# Patient Record
Sex: Female | Born: 1945
Health system: Southern US, Community
[De-identification: ages and names within clinical notes are randomized; demographics above are authoritative.]

## PROBLEM LIST (undated history)

## (undated) DIAGNOSIS — N189 Chronic kidney disease, unspecified: Secondary | ICD-10-CM

## (undated) DIAGNOSIS — T8859XA Other complications of anesthesia, initial encounter: Secondary | ICD-10-CM

## (undated) DIAGNOSIS — R002 Palpitations: Secondary | ICD-10-CM

## (undated) DIAGNOSIS — E785 Hyperlipidemia, unspecified: Secondary | ICD-10-CM

## (undated) DIAGNOSIS — I10 Essential (primary) hypertension: Secondary | ICD-10-CM

## (undated) DIAGNOSIS — M1990 Unspecified osteoarthritis, unspecified site: Secondary | ICD-10-CM

## (undated) DIAGNOSIS — Z87442 Personal history of urinary calculi: Secondary | ICD-10-CM

## (undated) DIAGNOSIS — I341 Nonrheumatic mitral (valve) prolapse: Secondary | ICD-10-CM

## (undated) HISTORY — PX: TUBAL LIGATION: SHX77

## (undated) HISTORY — PX: COLONOSCOPY: SHX174

## (undated) HISTORY — PX: WISDOM TOOTH EXTRACTION: SHX21

## (undated) HISTORY — DX: Essential (primary) hypertension: I10

## (undated) HISTORY — DX: Hyperlipidemia, unspecified: E78.5

---

## 1998-07-31 ENCOUNTER — Encounter: Payer: Self-pay | Admitting: Obstetrics and Gynecology

## 1998-08-03 ENCOUNTER — Ambulatory Visit (HOSPITAL_COMMUNITY): Admission: RE | Admit: 1998-08-03 | Discharge: 1998-08-03 | Payer: Self-pay | Admitting: Obstetrics and Gynecology

## 1999-12-09 ENCOUNTER — Other Ambulatory Visit: Admission: RE | Admit: 1999-12-09 | Discharge: 1999-12-09 | Payer: Self-pay | Admitting: Obstetrics and Gynecology

## 2000-08-28 ENCOUNTER — Other Ambulatory Visit: Admission: RE | Admit: 2000-08-28 | Discharge: 2000-08-28 | Payer: Self-pay | Admitting: Obstetrics and Gynecology

## 2000-10-13 ENCOUNTER — Ambulatory Visit (HOSPITAL_COMMUNITY): Admission: RE | Admit: 2000-10-13 | Discharge: 2000-10-13 | Payer: Self-pay | Admitting: Internal Medicine

## 2000-10-13 ENCOUNTER — Encounter: Payer: Self-pay | Admitting: Internal Medicine

## 2001-10-22 ENCOUNTER — Encounter: Admission: RE | Admit: 2001-10-22 | Discharge: 2001-10-22 | Payer: Self-pay | Admitting: Obstetrics and Gynecology

## 2001-10-22 ENCOUNTER — Encounter: Payer: Self-pay | Admitting: Obstetrics and Gynecology

## 2001-10-24 ENCOUNTER — Encounter: Payer: Self-pay | Admitting: Obstetrics and Gynecology

## 2001-10-24 ENCOUNTER — Encounter: Admission: RE | Admit: 2001-10-24 | Discharge: 2001-10-24 | Payer: Self-pay | Admitting: Obstetrics and Gynecology

## 2002-12-18 ENCOUNTER — Ambulatory Visit (HOSPITAL_COMMUNITY): Admission: RE | Admit: 2002-12-18 | Discharge: 2002-12-18 | Payer: Self-pay | Admitting: Obstetrics and Gynecology

## 2002-12-18 ENCOUNTER — Encounter (INDEPENDENT_AMBULATORY_CARE_PROVIDER_SITE_OTHER): Payer: Self-pay

## 2002-12-20 ENCOUNTER — Encounter: Admission: RE | Admit: 2002-12-20 | Discharge: 2002-12-20 | Payer: Self-pay | Admitting: Obstetrics and Gynecology

## 2002-12-20 ENCOUNTER — Encounter: Payer: Self-pay | Admitting: Obstetrics and Gynecology

## 2003-01-15 ENCOUNTER — Emergency Department (HOSPITAL_COMMUNITY): Admission: EM | Admit: 2003-01-15 | Discharge: 2003-01-15 | Payer: Self-pay | Admitting: Emergency Medicine

## 2003-01-15 ENCOUNTER — Encounter: Payer: Self-pay | Admitting: Emergency Medicine

## 2003-08-29 ENCOUNTER — Encounter (INDEPENDENT_AMBULATORY_CARE_PROVIDER_SITE_OTHER): Payer: Self-pay | Admitting: Specialist

## 2003-08-29 ENCOUNTER — Ambulatory Visit (HOSPITAL_COMMUNITY): Admission: RE | Admit: 2003-08-29 | Discharge: 2003-08-29 | Payer: Self-pay | Admitting: Gastroenterology

## 2003-09-22 ENCOUNTER — Other Ambulatory Visit: Admission: RE | Admit: 2003-09-22 | Discharge: 2003-09-22 | Payer: Self-pay | Admitting: Obstetrics and Gynecology

## 2004-10-01 ENCOUNTER — Other Ambulatory Visit: Admission: RE | Admit: 2004-10-01 | Discharge: 2004-10-01 | Payer: Self-pay | Admitting: Obstetrics and Gynecology

## 2005-10-06 ENCOUNTER — Other Ambulatory Visit: Admission: RE | Admit: 2005-10-06 | Discharge: 2005-10-06 | Payer: Self-pay | Admitting: Obstetrics and Gynecology

## 2006-10-12 ENCOUNTER — Other Ambulatory Visit: Admission: RE | Admit: 2006-10-12 | Discharge: 2006-10-12 | Payer: Self-pay | Admitting: Obstetrics and Gynecology

## 2007-10-15 ENCOUNTER — Other Ambulatory Visit: Admission: RE | Admit: 2007-10-15 | Discharge: 2007-10-15 | Payer: Self-pay | Admitting: Obstetrics and Gynecology

## 2008-01-17 ENCOUNTER — Other Ambulatory Visit: Admission: RE | Admit: 2008-01-17 | Discharge: 2008-01-17 | Payer: Self-pay | Admitting: Obstetrics and Gynecology

## 2008-07-16 ENCOUNTER — Other Ambulatory Visit: Admission: RE | Admit: 2008-07-16 | Discharge: 2008-07-16 | Payer: Self-pay | Admitting: Obstetrics and Gynecology

## 2009-01-09 ENCOUNTER — Other Ambulatory Visit: Admission: RE | Admit: 2009-01-09 | Discharge: 2009-01-09 | Payer: Self-pay | Admitting: Obstetrics and Gynecology

## 2010-02-16 ENCOUNTER — Other Ambulatory Visit: Admission: RE | Admit: 2010-02-16 | Discharge: 2010-02-16 | Payer: Self-pay | Admitting: Obstetrics and Gynecology

## 2010-09-17 NOTE — Op Note (Signed)
NAMEMARIYA, Kelly Reyes                      ACCOUNT NO.:  [id number]   MEDICAL RECORD NO.:  [id number]                   PATIENT TYPE:  AMB   LOCATION:  SDC                                  FACILITY:  [facility]   PHYSICIAN:  Artist Pais, M.D.                 DATE OF BIRTH:  1945-06-30   DATE OF PROCEDURE:  12/18/2002  DATE OF DISCHARGE:                                 OPERATIVE REPORT   PREOPERATIVE DIAGNOSES:  1. Polypoid lesion of posterior endometrium on sonohysterogram.  2. Postmenopausal bleeding.   POSTOPERATIVE DIAGNOSES:  1. Polypoid lesion of posterior endometrium on sonohysterogram.  2. Postmenopausal bleeding.  3. Polypoid lesion, left uterine wall.   PROCEDURES:  1. Dilatation and curettage.  2. Hysteroscopy.  3. Polypectomy.   SURGEON:  Artist Pais, M.D.   ANESTHESIA:  General endotracheal.   ESTIMATED BLOOD LOSS:  Minimal.   FLUIDS REPLACED:  1500 mL crystalloid.   COMPLICATIONS:  None.   DRAINS:  None.   DESCRIPTION OF OPERATION:  The patient was brought to the operating room,  identified on the operating room table.  After induction of adequate general  endotracheal anesthesia, the patient was placed in the dorsal lithotomy  position and prepped and draped in the usual sterile fashion.  The bladder  was straight catheterized with a red rubber catheter with no urine obtained.  Subsequently a bimanual examination revealed the uterus to be retroverted.  It had been noted to be retroverted on numerous occasions.  The posterior  lip of the cervix was infiltrated with 1 mL of 1% lidocaine, and the  remaining 19 mL were placed for a paracervical block.  Then cervix was then  very gently and carefully dilated up to a #23 Pratt dilator.  Dilatation  proceeded very gently and carefully to decrease the risk of uterine  perforation.  With the placement of a #2 Pratt dilator it was impossible to  dilate past that dilator size.  I did attempt several times with a  #25 but  was unable to achieve dilatation, and it was this surgeon's opinion that to  continue to try to dilate increased the risk of uterine perforation.  The  uterus did sound to approximately 8.5 cm.  Using sorbitol as a distending  medium, a hysteroscopic examination was performed.  In order to guide the  hysteroscope into the uterine cavity, I had to do this under direct  visualization.  I was able to guide the scope halfway up, but there was a  fibrous band which was thought to be probably the internal os.  It was  impossible to place the scope farther.  Subsequently using the serrated  curette after the scope was withdrawn, a careful and thorough curettage was  performed with a good cry heard all around.  Subsequently I did attempt to  dilate her further using a #23 Pratt dilator and was able to then gently  manipulate the scope up to the fundus of the uterus.  There were noted to be  no space-occupying lesions of the fundus of the uterus and, in fact, the  entire uterine cavity was noted to be well-sampled and clean with the  exception of a polypoid area of the left uterine sidewall.  We subsequently  curetted over this area and placed the Randall stone forceps with the tissue  obtained and sent to pathology for examination.  Subsequently the internal  os again constricted and was impossible to place the scope to confirm that  the polyp had been removed.  It was the surgeon's opinion that to redilate  her would increase the risk of uterine perforation and since I was able to  obtain the tissue, it was felt that to proceed further would be foolhardy.  At that point the procedure was then terminated.  The patient tolerated the  procedure well without apparent complications and was transferred to the  recovery room in stable condition after all instrument, sponge, and needle  counts were correct.  The patient received a post-D&C instruction sheet and  is urged to return to the office  in two weeks for a postoperative evaluation  and to call with any problems.                                               Artist Pais, M.D.    DC/MEDQ  D:  12/18/2002  T:  12/19/2002  Job:  [id number]

## 2010-09-17 NOTE — H&P (Signed)
NAME:  Kelly Reyes, Kelly Reyes                      ACCOUNT NO.:  [id number]   MEDICAL RECORD NO.:  [id number]                   PATIENT TYPE:  AMB   LOCATION:  SDC                                  [facility]ILITY:  [facility]   PHYSICIAN:  Artist Pais, M.D.                 DATE OF BIRTH:  Dec 15, 1945   DATE OF ADMISSION:  12/18/2002  DATE OF DISCHARGE:                                HISTORY & PHYSICAL   HISTORY OF PRESENT ILLNESS:  The patient is a 65 year old African American  female who presented in May for a complete physical.  She had been taking  hormone replacement therapy, but had some postmenopausal bleeding.  She had  not forgotten a pill nor taken any additional medications.  She subsequently  was evaluated with a pelvic ultrasound which revealed an endometrium of 6 mm  and subsequently had a sonohysterogram, at which point she was found to have  an 11 x 12 mm posterior wall defect consistent with an endometrial polyp.  She was subsequently advised to undergo a D&C and hysteroscopy due to the  endometrial polyp.  The risks of surgery, including anesthetic complication,  hemorrhage, infection, and damage to adjacent structures, including bladder,  bowel, blood vessels, and ureter, were discussed with the patient.  She was  made aware of the risk of uterine perforation which could result in  overwhelming life-threatening hemorrhage requiring emergent hysterectomy and  uterine perforation which could result in bowel damage.  This could require  an emergent colostomy or could result in overwhelming life-threatening  peritonitis.  She expressed understanding of and acceptance of these risks  and desires to proceed with surgery.  Her pelvic ultrasound showed her  ovaries to be normal uterus is retroverted, measuring 9.1 x 4.9 x 5.5 cm.   PAST MEDICAL HISTORY:  1. Hypertension.  2. Atypical chest pain.  3. History of dysmenorrhea followed by Esmeralda Arthur, M.D., in the     past.   ALLERGIES:  No known drug allergies.   CURRENT MEDICATIONS:  Estrogen patch, Prometrium, HCTZ, and Micardis.   PAST SURGICAL HISTORY:  1. D&C in 2000.  2. Breast biopsy years ago by Dr. Ezzard Standing.  3. Tubal ligation in 1978.   FAMILY HISTORY:  There is no family history of colon, breast, or prostate  cancer.  One of the patient's distant relatives had ovarian cancer.  The  patient's mother is 49 with hypertension.  Her father died at 62 of heart  disease, hypertension, and diabetes.  The patient has three brothers alive  and well.  She has one brother who is in poor health with hypertension and  diabetes and two children who are alive and well.   SOCIAL HISTORY:  The patient is a Dance movement psychotherapist at [employer].  She does not smoke or use alcohol.   REVIEW OF SYSTEMS:  Noncontributory, except as noted above.  Denies  headache,  visual changes, chest pain, shortness of breath, abdominal pain,  change in bowel habits, unintentional weight loss, dysuria, urgency,  frequency, vaginal pruritus or discharge, and pain or bleeding with  intercourse.   PHYSICAL EXAMINATION:  GENERAL APPEARANCE:  A well-developed African  American female.  VITAL SIGNS:  Blood pressure 114/82, heart rate 80.  WEIGHT:  143 pounds.  HEENT:  Normal.  NECK:  Supple without thyromegaly, adenopathy, or nodules.  CHEST:  Clear to auscultation.  BREASTS:  Symmetrical without masses.  No dimpling, retraction, or nipple  discharge.  CARDIAC:  Regular rate and rhythm without extra sounds or murmurs.  ABDOMEN:  Soft and nontender.  No hepatosplenomegaly or masses.  EXTREMITIES:  No cyanosis, clubbing, or edema.  NEUROLOGIC:  Oriented x 3.  Grossly normal.  PELVIC:  Normal external genitalia.  No vulvar, vaginal, or cervical  lesions.  Pap smear __________  performed at the time of her complete  physical in May of 2004 and was found to be negative.  Bimanual examination  reveals the patient's uterus  to be normal, mobile, and retroverted without  adnexal mass palpated.  RECTAL:  Excellent sphincter tone.  Confirms pelvic exam.  No masses  palpated.   ASSESSMENT AND PLAN:  The patient is a postmenopausal female with  postmenopausal bleeding who was found on evaluation for this bleeding to  have an endometrial polyp.  She is admitted for [facility], hysteroscopy, and  polypectomy.  The risks have been explained to the patient.  She expresses  understanding of and acceptance of these risks.                                               Artist Pais, M.D.    DC/MEDQ  D:  12/17/2002  T:  12/17/2002  Job:  [id number]   cc:   Pre-Op area

## 2011-02-24 ENCOUNTER — Other Ambulatory Visit: Payer: Self-pay | Admitting: Obstetrics and Gynecology

## 2011-02-24 ENCOUNTER — Other Ambulatory Visit (HOSPITAL_COMMUNITY)
Admission: RE | Admit: 2011-02-24 | Discharge: 2011-02-24 | Disposition: A | Discharge status: Home / Self Care | Payer: BC Managed Care – PPO | Source: Ambulatory Visit | Attending: Obstetrics and Gynecology | Admitting: Obstetrics and Gynecology

## 2011-02-24 DIAGNOSIS — Z01419 Encounter for gynecological examination (general) (routine) without abnormal findings: Secondary | ICD-10-CM | POA: Insufficient documentation

## 2012-02-28 ENCOUNTER — Other Ambulatory Visit: Payer: Self-pay | Admitting: Obstetrics and Gynecology

## 2012-02-28 ENCOUNTER — Other Ambulatory Visit (HOSPITAL_COMMUNITY)
Admission: RE | Admit: 2012-02-28 | Discharge: 2012-02-28 | Disposition: A | Discharge status: Home / Self Care | Payer: BC Managed Care – PPO | Source: Ambulatory Visit | Attending: Obstetrics and Gynecology | Admitting: Obstetrics and Gynecology

## 2012-02-28 DIAGNOSIS — Z01419 Encounter for gynecological examination (general) (routine) without abnormal findings: Secondary | ICD-10-CM | POA: Insufficient documentation

## 2013-01-04 DIAGNOSIS — R300 Dysuria: Secondary | ICD-10-CM | POA: Diagnosis not present

## 2013-01-04 DIAGNOSIS — H01009 Unspecified blepharitis unspecified eye, unspecified eyelid: Secondary | ICD-10-CM | POA: Diagnosis not present

## 2013-02-18 DIAGNOSIS — Z23 Encounter for immunization: Secondary | ICD-10-CM | POA: Diagnosis not present

## 2013-02-28 ENCOUNTER — Other Ambulatory Visit: Payer: Self-pay | Admitting: Obstetrics and Gynecology

## 2013-02-28 ENCOUNTER — Other Ambulatory Visit (HOSPITAL_COMMUNITY)
Admission: RE | Admit: 2013-02-28 | Discharge: 2013-02-28 | Disposition: A | Discharge status: Home / Self Care | Payer: Medicare Other | Source: Ambulatory Visit | Attending: Obstetrics and Gynecology | Admitting: Obstetrics and Gynecology

## 2013-02-28 DIAGNOSIS — E782 Mixed hyperlipidemia: Secondary | ICD-10-CM | POA: Diagnosis not present

## 2013-02-28 DIAGNOSIS — Z1151 Encounter for screening for human papillomavirus (HPV): Secondary | ICD-10-CM | POA: Insufficient documentation

## 2013-02-28 DIAGNOSIS — I10 Essential (primary) hypertension: Secondary | ICD-10-CM | POA: Diagnosis not present

## 2013-02-28 DIAGNOSIS — Z780 Asymptomatic menopausal state: Secondary | ICD-10-CM | POA: Diagnosis not present

## 2013-02-28 DIAGNOSIS — Z0000 Encounter for general adult medical examination without abnormal findings: Secondary | ICD-10-CM | POA: Diagnosis not present

## 2013-02-28 DIAGNOSIS — Z01419 Encounter for gynecological examination (general) (routine) without abnormal findings: Secondary | ICD-10-CM | POA: Diagnosis not present

## 2013-02-28 DIAGNOSIS — Z124 Encounter for screening for malignant neoplasm of cervix: Secondary | ICD-10-CM | POA: Diagnosis not present

## 2013-02-28 DIAGNOSIS — Z23 Encounter for immunization: Secondary | ICD-10-CM | POA: Diagnosis not present

## 2013-02-28 DIAGNOSIS — K649 Unspecified hemorrhoids: Secondary | ICD-10-CM | POA: Diagnosis not present

## 2013-02-28 DIAGNOSIS — Z1211 Encounter for screening for malignant neoplasm of colon: Secondary | ICD-10-CM | POA: Diagnosis not present

## 2013-03-20 DIAGNOSIS — L299 Pruritus, unspecified: Secondary | ICD-10-CM | POA: Diagnosis not present

## 2013-03-20 DIAGNOSIS — D239 Other benign neoplasm of skin, unspecified: Secondary | ICD-10-CM | POA: Diagnosis not present

## 2013-03-20 DIAGNOSIS — L219 Seborrheic dermatitis, unspecified: Secondary | ICD-10-CM | POA: Diagnosis not present

## 2013-04-11 DIAGNOSIS — Z780 Asymptomatic menopausal state: Secondary | ICD-10-CM | POA: Diagnosis not present

## 2013-05-07 ENCOUNTER — Other Ambulatory Visit: Payer: Self-pay | Admitting: Internal Medicine

## 2013-05-07 ENCOUNTER — Ambulatory Visit
Admission: RE | Admit: 2013-05-07 | Discharge: 2013-05-07 | Disposition: A | Discharge status: Home / Self Care | Payer: Medicare Other | Source: Ambulatory Visit | Attending: Internal Medicine | Admitting: Internal Medicine

## 2013-05-07 DIAGNOSIS — G5600 Carpal tunnel syndrome, unspecified upper limb: Secondary | ICD-10-CM | POA: Diagnosis not present

## 2013-05-07 DIAGNOSIS — M19041 Primary osteoarthritis, right hand: Secondary | ICD-10-CM

## 2013-05-07 DIAGNOSIS — M19049 Primary osteoarthritis, unspecified hand: Secondary | ICD-10-CM | POA: Diagnosis not present

## 2013-06-06 DIAGNOSIS — E782 Mixed hyperlipidemia: Secondary | ICD-10-CM | POA: Diagnosis not present

## 2013-06-06 DIAGNOSIS — I10 Essential (primary) hypertension: Secondary | ICD-10-CM | POA: Diagnosis not present

## 2013-08-08 DIAGNOSIS — Z1231 Encounter for screening mammogram for malignant neoplasm of breast: Secondary | ICD-10-CM | POA: Diagnosis not present

## 2013-08-29 DIAGNOSIS — Z86010 Personal history of colonic polyps: Secondary | ICD-10-CM | POA: Diagnosis not present

## 2013-08-29 DIAGNOSIS — Z09 Encounter for follow-up examination after completed treatment for conditions other than malignant neoplasm: Secondary | ICD-10-CM | POA: Diagnosis not present

## 2013-09-30 DIAGNOSIS — H5210 Myopia, unspecified eye: Secondary | ICD-10-CM | POA: Diagnosis not present

## 2013-09-30 DIAGNOSIS — H40019 Open angle with borderline findings, low risk, unspecified eye: Secondary | ICD-10-CM | POA: Diagnosis not present

## 2013-12-12 DIAGNOSIS — I10 Essential (primary) hypertension: Secondary | ICD-10-CM | POA: Diagnosis not present

## 2013-12-12 DIAGNOSIS — E782 Mixed hyperlipidemia: Secondary | ICD-10-CM | POA: Diagnosis not present

## 2014-03-03 DIAGNOSIS — N952 Postmenopausal atrophic vaginitis: Secondary | ICD-10-CM | POA: Diagnosis not present

## 2014-04-23 DIAGNOSIS — L720 Epidermal cyst: Secondary | ICD-10-CM | POA: Diagnosis not present

## 2014-04-23 DIAGNOSIS — L309 Dermatitis, unspecified: Secondary | ICD-10-CM | POA: Diagnosis not present

## 2014-04-23 DIAGNOSIS — D224 Melanocytic nevi of scalp and neck: Secondary | ICD-10-CM | POA: Diagnosis not present

## 2014-04-23 DIAGNOSIS — D225 Melanocytic nevi of trunk: Secondary | ICD-10-CM | POA: Diagnosis not present

## 2014-06-06 DIAGNOSIS — L300 Nummular dermatitis: Secondary | ICD-10-CM | POA: Diagnosis not present

## 2014-06-06 DIAGNOSIS — L299 Pruritus, unspecified: Secondary | ICD-10-CM | POA: Diagnosis not present

## 2014-06-06 DIAGNOSIS — L239 Allergic contact dermatitis, unspecified cause: Secondary | ICD-10-CM | POA: Diagnosis not present

## 2014-07-04 DIAGNOSIS — Z0000 Encounter for general adult medical examination without abnormal findings: Secondary | ICD-10-CM | POA: Diagnosis not present

## 2014-07-04 DIAGNOSIS — I10 Essential (primary) hypertension: Secondary | ICD-10-CM | POA: Diagnosis not present

## 2014-07-04 DIAGNOSIS — Z23 Encounter for immunization: Secondary | ICD-10-CM | POA: Diagnosis not present

## 2014-07-04 DIAGNOSIS — Z1389 Encounter for screening for other disorder: Secondary | ICD-10-CM | POA: Diagnosis not present

## 2014-07-04 DIAGNOSIS — E785 Hyperlipidemia, unspecified: Secondary | ICD-10-CM | POA: Diagnosis not present

## 2014-09-11 DIAGNOSIS — Z1231 Encounter for screening mammogram for malignant neoplasm of breast: Secondary | ICD-10-CM | POA: Diagnosis not present

## 2014-10-06 DIAGNOSIS — R42 Dizziness and giddiness: Secondary | ICD-10-CM | POA: Diagnosis not present

## 2014-10-06 DIAGNOSIS — H9202 Otalgia, left ear: Secondary | ICD-10-CM | POA: Diagnosis not present

## 2014-10-09 DIAGNOSIS — R42 Dizziness and giddiness: Secondary | ICD-10-CM | POA: Diagnosis not present

## 2014-10-29 DIAGNOSIS — H40013 Open angle with borderline findings, low risk, bilateral: Secondary | ICD-10-CM | POA: Diagnosis not present

## 2014-10-29 DIAGNOSIS — H25013 Cortical age-related cataract, bilateral: Secondary | ICD-10-CM | POA: Diagnosis not present

## 2014-10-29 DIAGNOSIS — H5213 Myopia, bilateral: Secondary | ICD-10-CM | POA: Diagnosis not present

## 2014-11-20 DIAGNOSIS — R42 Dizziness and giddiness: Secondary | ICD-10-CM | POA: Diagnosis not present

## 2014-12-02 DIAGNOSIS — R42 Dizziness and giddiness: Secondary | ICD-10-CM | POA: Diagnosis not present

## 2014-12-02 DIAGNOSIS — H9312 Tinnitus, left ear: Secondary | ICD-10-CM | POA: Diagnosis not present

## 2014-12-02 DIAGNOSIS — H8122 Vestibular neuronitis, left ear: Secondary | ICD-10-CM | POA: Diagnosis not present

## 2014-12-02 DIAGNOSIS — H9202 Otalgia, left ear: Secondary | ICD-10-CM | POA: Diagnosis not present

## 2014-12-02 DIAGNOSIS — H9192 Unspecified hearing loss, left ear: Secondary | ICD-10-CM | POA: Diagnosis not present

## 2014-12-02 DIAGNOSIS — H93292 Other abnormal auditory perceptions, left ear: Secondary | ICD-10-CM | POA: Diagnosis not present

## 2014-12-26 DIAGNOSIS — J209 Acute bronchitis, unspecified: Secondary | ICD-10-CM | POA: Diagnosis not present

## 2015-01-02 ENCOUNTER — Ambulatory Visit
Admission: RE | Admit: 2015-01-02 | Discharge: 2015-01-02 | Disposition: A | Discharge status: Home / Self Care | Payer: Medicare Other | Source: Ambulatory Visit | Attending: Internal Medicine | Admitting: Internal Medicine

## 2015-01-02 ENCOUNTER — Other Ambulatory Visit: Payer: Self-pay | Admitting: Internal Medicine

## 2015-01-02 DIAGNOSIS — R05 Cough: Secondary | ICD-10-CM

## 2015-01-02 DIAGNOSIS — I10 Essential (primary) hypertension: Secondary | ICD-10-CM | POA: Diagnosis not present

## 2015-01-02 DIAGNOSIS — E785 Hyperlipidemia, unspecified: Secondary | ICD-10-CM | POA: Diagnosis not present

## 2015-01-02 DIAGNOSIS — R059 Cough, unspecified: Secondary | ICD-10-CM

## 2015-01-29 DIAGNOSIS — Z23 Encounter for immunization: Secondary | ICD-10-CM | POA: Diagnosis not present

## 2015-01-29 DIAGNOSIS — L989 Disorder of the skin and subcutaneous tissue, unspecified: Secondary | ICD-10-CM | POA: Diagnosis not present

## 2015-02-10 DIAGNOSIS — L282 Other prurigo: Secondary | ICD-10-CM | POA: Diagnosis not present

## 2015-02-10 DIAGNOSIS — S80862S Insect bite (nonvenomous), left lower leg, sequela: Secondary | ICD-10-CM | POA: Diagnosis not present

## 2015-02-10 DIAGNOSIS — S80861S Insect bite (nonvenomous), right lower leg, sequela: Secondary | ICD-10-CM | POA: Diagnosis not present

## 2015-03-16 ENCOUNTER — Other Ambulatory Visit (HOSPITAL_COMMUNITY)
Admission: RE | Admit: 2015-03-16 | Discharge: 2015-03-16 | Disposition: A | Discharge status: Home / Self Care | Payer: Medicare Other | Source: Ambulatory Visit | Attending: Obstetrics and Gynecology | Admitting: Obstetrics and Gynecology

## 2015-03-16 ENCOUNTER — Other Ambulatory Visit: Payer: Self-pay | Admitting: Obstetrics and Gynecology

## 2015-03-16 DIAGNOSIS — Z124 Encounter for screening for malignant neoplasm of cervix: Secondary | ICD-10-CM | POA: Diagnosis not present

## 2015-03-16 DIAGNOSIS — Z01419 Encounter for gynecological examination (general) (routine) without abnormal findings: Secondary | ICD-10-CM | POA: Diagnosis not present

## 2015-03-16 DIAGNOSIS — R102 Pelvic and perineal pain: Secondary | ICD-10-CM | POA: Diagnosis not present

## 2015-03-17 DIAGNOSIS — R102 Pelvic and perineal pain: Secondary | ICD-10-CM | POA: Diagnosis not present

## 2015-03-17 LAB — CYTOLOGY - PAP

## 2015-06-26 DIAGNOSIS — L218 Other seborrheic dermatitis: Secondary | ICD-10-CM | POA: Diagnosis not present

## 2015-06-26 DIAGNOSIS — L819 Disorder of pigmentation, unspecified: Secondary | ICD-10-CM | POA: Diagnosis not present

## 2015-06-26 DIAGNOSIS — D225 Melanocytic nevi of trunk: Secondary | ICD-10-CM | POA: Diagnosis not present

## 2015-06-26 DIAGNOSIS — L738 Other specified follicular disorders: Secondary | ICD-10-CM | POA: Diagnosis not present

## 2015-07-08 DIAGNOSIS — Z0000 Encounter for general adult medical examination without abnormal findings: Secondary | ICD-10-CM | POA: Diagnosis not present

## 2015-07-08 DIAGNOSIS — M2550 Pain in unspecified joint: Secondary | ICD-10-CM | POA: Diagnosis not present

## 2015-07-08 DIAGNOSIS — I10 Essential (primary) hypertension: Secondary | ICD-10-CM | POA: Diagnosis not present

## 2015-07-08 DIAGNOSIS — Z1389 Encounter for screening for other disorder: Secondary | ICD-10-CM | POA: Diagnosis not present

## 2015-07-08 DIAGNOSIS — E785 Hyperlipidemia, unspecified: Secondary | ICD-10-CM | POA: Diagnosis not present

## 2015-11-10 DIAGNOSIS — H40013 Open angle with borderline findings, low risk, bilateral: Secondary | ICD-10-CM | POA: Diagnosis not present

## 2015-11-10 DIAGNOSIS — H40052 Ocular hypertension, left eye: Secondary | ICD-10-CM | POA: Diagnosis not present

## 2015-11-10 DIAGNOSIS — H40051 Ocular hypertension, right eye: Secondary | ICD-10-CM | POA: Diagnosis not present

## 2015-11-10 DIAGNOSIS — H5213 Myopia, bilateral: Secondary | ICD-10-CM | POA: Diagnosis not present

## 2015-12-01 DIAGNOSIS — E7800 Pure hypercholesterolemia, unspecified: Secondary | ICD-10-CM | POA: Diagnosis not present

## 2015-12-01 DIAGNOSIS — I10 Essential (primary) hypertension: Secondary | ICD-10-CM | POA: Diagnosis not present

## 2015-12-01 DIAGNOSIS — E559 Vitamin D deficiency, unspecified: Secondary | ICD-10-CM | POA: Diagnosis not present

## 2016-01-01 ENCOUNTER — Ambulatory Visit (INDEPENDENT_AMBULATORY_CARE_PROVIDER_SITE_OTHER): Payer: Medicare Other | Admitting: Internal Medicine

## 2016-01-01 DIAGNOSIS — Z9189 Other specified personal risk factors, not elsewhere classified: Secondary | ICD-10-CM | POA: Diagnosis not present

## 2016-01-01 DIAGNOSIS — Z789 Other specified health status: Secondary | ICD-10-CM | POA: Diagnosis not present

## 2016-01-01 DIAGNOSIS — Z7189 Other specified counseling: Secondary | ICD-10-CM | POA: Diagnosis not present

## 2016-01-01 DIAGNOSIS — Z23 Encounter for immunization: Secondary | ICD-10-CM

## 2016-01-01 DIAGNOSIS — IMO0002 Reserved for concepts with insufficient information to code with codable children: Secondary | ICD-10-CM

## 2016-01-01 MED ORDER — ATOVAQUONE-PROGUANIL HCL 250-100 MG PO TABS: 1.0000 | ORAL_TABLET | Freq: Every day | ORAL | 0 refills | Status: DC

## 2016-01-01 MED ORDER — MECLIZINE HCL 32 MG PO TABS: 32.0000 mg | ORAL_TABLET | Freq: Three times a day (TID) | ORAL | 0 refills | Status: DC | PRN

## 2016-01-01 MED ORDER — AZITHROMYCIN 500 MG PO TABS: 500.0000 mg | ORAL_TABLET | Freq: Every day | ORAL | 0 refills | Status: DC

## 2016-01-01 NOTE — Progress Notes (Signed)
   RFV: pre travel counseling for [place]  Patient ID: Kelly Reyes, female   DOB: 08-Nov-1945, 70 y.o.   MRN: [id number]  HPI  70yo F who has past med hx of HTN and hyperlipidemia will be leaving with her husband to [place] from sep 21 to oct 1st visiting predominantly in [place] but possibly going to [place].  She is in good state of health, has not had flu vaccine for the year yet. She last had tdap in 2007. She does suffer from vertigo occasionally and would like refill on meclezine  Health Maintenance Due  Topic Date Due  . Hepatitis C Screening  1946/02/17  . TETANUS/TDAP  01/12/1967  . MAMMOGRAM  01/11/1998  . COLONOSCOPY  01/11/1998  . ZOSTAVAX  01/12/2008  . DEXA SCAN  01/11/2013  . PNA vac Low Risk Adult (1 of 2 - PCV13) 01/11/2013  . INFLUENZA VACCINE  12/01/2015      Assessment and Plan  Pre travel vaccination = recommended to get hep A, Typhoid, and tdap today. They can go to pharmacy next week to get flu vaccine  Traveler's diarrhea = gave precautions and will give rx for azithromycin to use if needed  Malaria prophylaxis = unclear how much time they will or if they will be going to [place]. Will give rx for malarone to cover length of trip, instructed to use if they are going to safari during their stay  Vertigo = will give rx for meclizine to use if needed

## 2016-01-08 DIAGNOSIS — Z1231 Encounter for screening mammogram for malignant neoplasm of breast: Secondary | ICD-10-CM | POA: Diagnosis not present

## 2016-01-13 DIAGNOSIS — J302 Other seasonal allergic rhinitis: Secondary | ICD-10-CM | POA: Diagnosis not present

## 2016-01-13 DIAGNOSIS — H9313 Tinnitus, bilateral: Secondary | ICD-10-CM | POA: Diagnosis not present

## 2016-01-13 DIAGNOSIS — H6983 Other specified disorders of Eustachian tube, bilateral: Secondary | ICD-10-CM | POA: Diagnosis not present

## 2016-01-13 DIAGNOSIS — H6122 Impacted cerumen, left ear: Secondary | ICD-10-CM | POA: Diagnosis not present

## 2016-06-26 IMAGING — CR DG CHEST 2V
2 series · 2 of 2 positions shown · non-contrast
Comparison: One-view chest x-ray 06/25/2012

CLINICAL DATA: Right cough.  Anterior chest pain.

EXAM:
CHEST - 2 VIEW

[w chest pa]
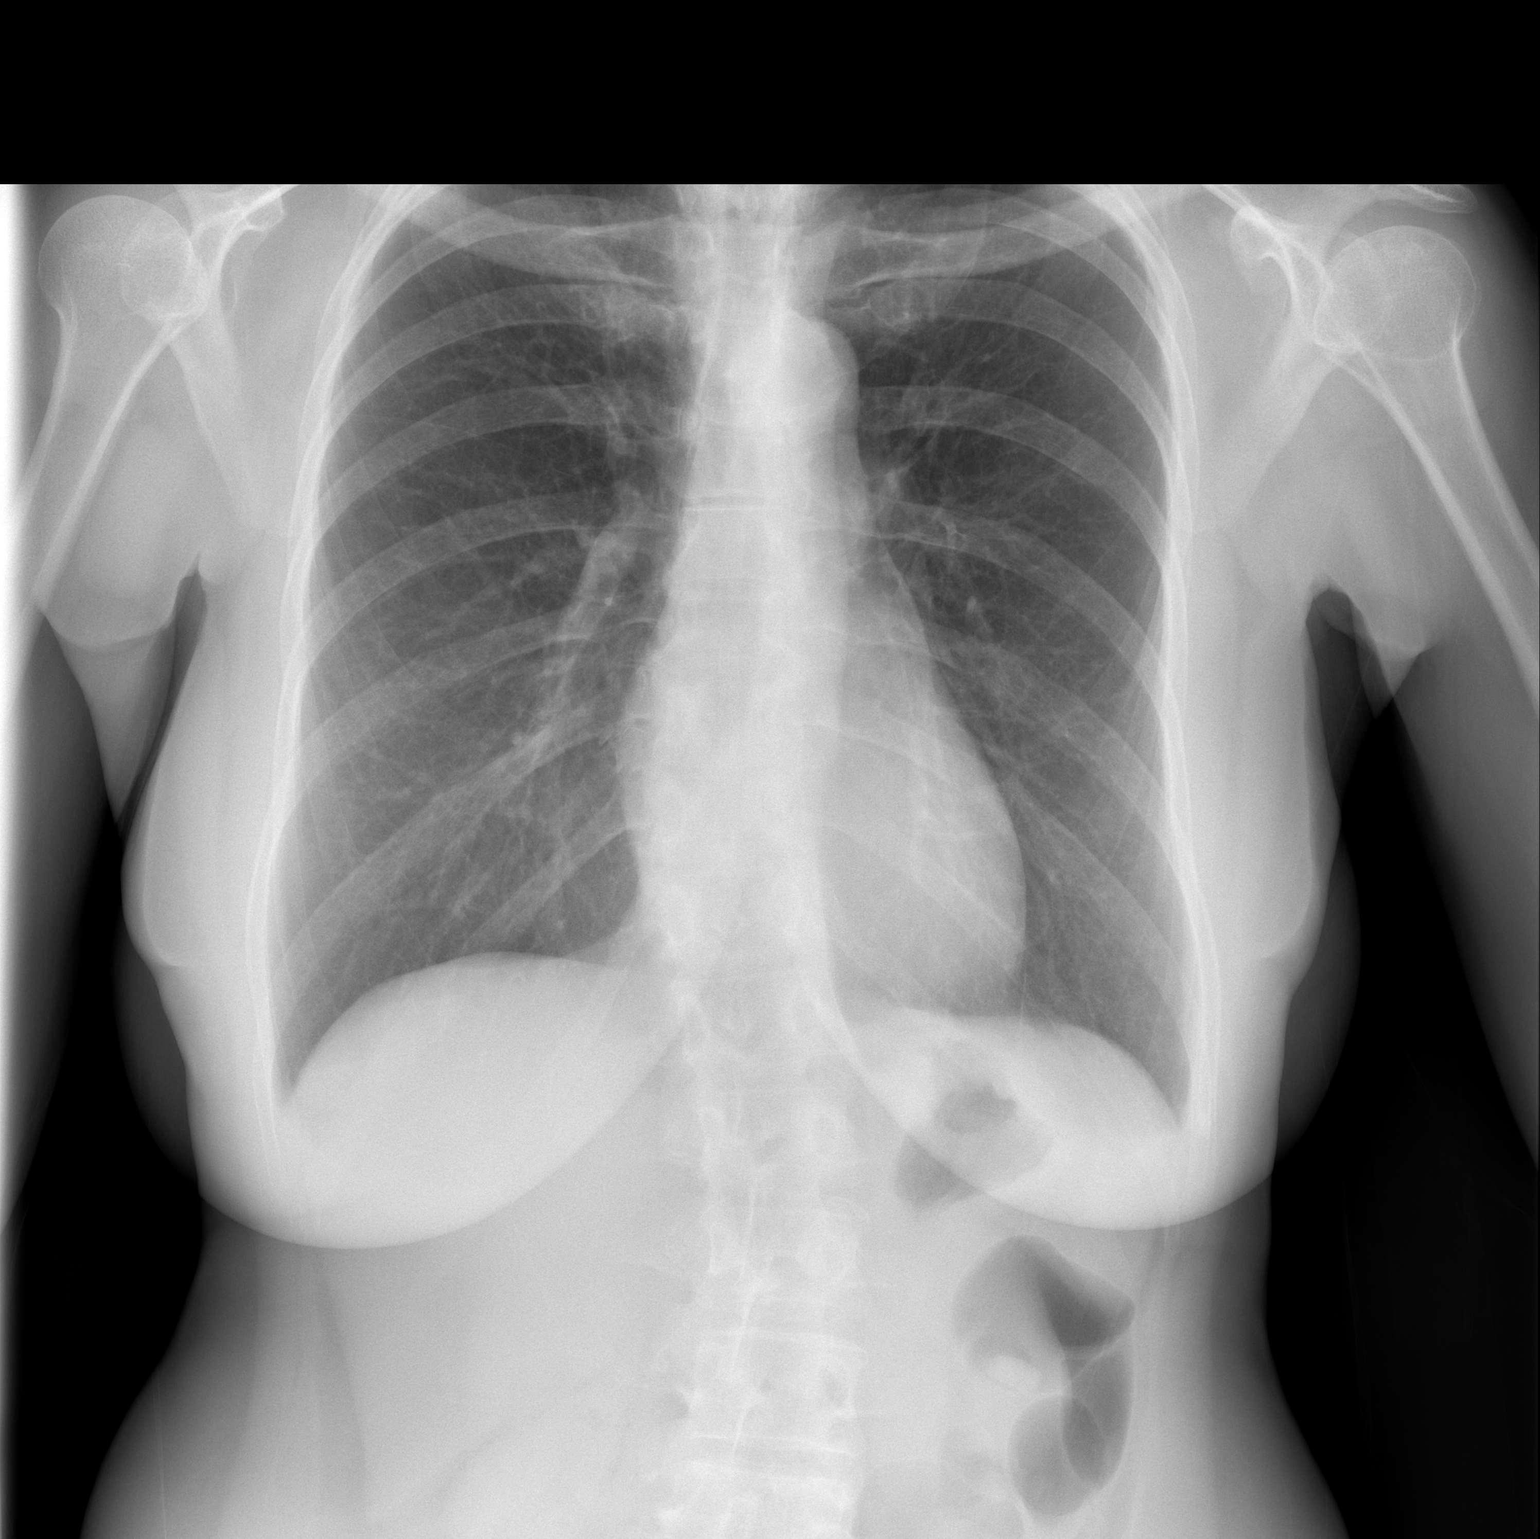

[w chest lat]
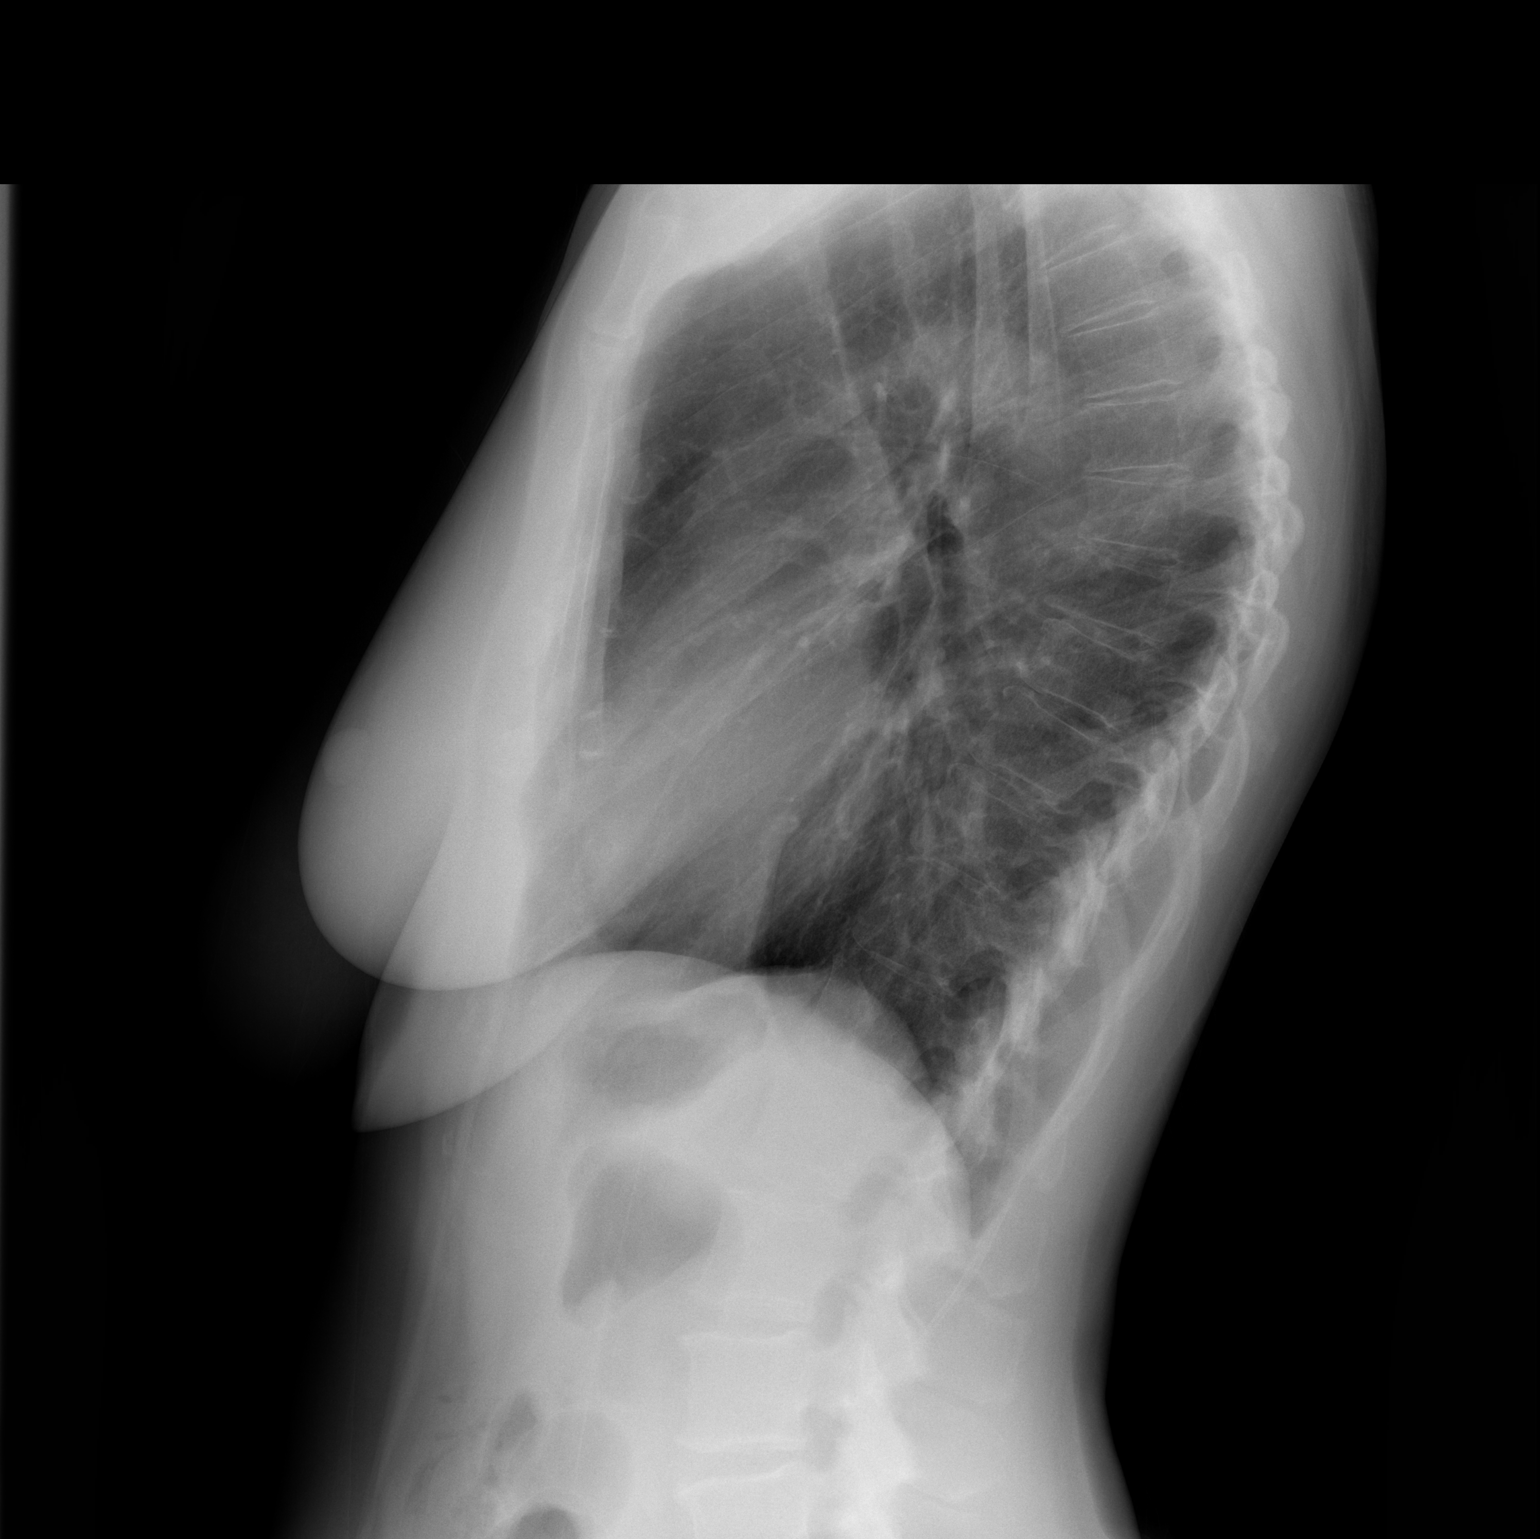

[2 of 2 positions shown; findings below may reference images not displayed]

FINDINGS: Heart size is normal. Lungs are clear. Scoliosis of the
thoracolumbar spine is convex to the right in the lower thoracic
spine and to the left in the lumbar spine.
IMPRESSION: 1. No acute or focal lung disease.
2. Scoliosis.

## 2016-07-28 DIAGNOSIS — Z23 Encounter for immunization: Secondary | ICD-10-CM | POA: Diagnosis not present

## 2016-07-28 DIAGNOSIS — Z1389 Encounter for screening for other disorder: Secondary | ICD-10-CM | POA: Diagnosis not present

## 2016-07-28 DIAGNOSIS — Z0000 Encounter for general adult medical examination without abnormal findings: Secondary | ICD-10-CM | POA: Diagnosis not present

## 2016-09-05 DIAGNOSIS — L218 Other seborrheic dermatitis: Secondary | ICD-10-CM | POA: Diagnosis not present

## 2016-09-05 DIAGNOSIS — L249 Irritant contact dermatitis, unspecified cause: Secondary | ICD-10-CM | POA: Diagnosis not present

## 2016-09-05 DIAGNOSIS — L819 Disorder of pigmentation, unspecified: Secondary | ICD-10-CM | POA: Diagnosis not present

## 2016-09-05 DIAGNOSIS — D225 Melanocytic nevi of trunk: Secondary | ICD-10-CM | POA: Diagnosis not present

## 2016-09-05 DIAGNOSIS — D2371 Other benign neoplasm of skin of right lower limb, including hip: Secondary | ICD-10-CM | POA: Diagnosis not present

## 2016-09-05 DIAGNOSIS — L603 Nail dystrophy: Secondary | ICD-10-CM | POA: Diagnosis not present

## 2016-09-05 DIAGNOSIS — D2272 Melanocytic nevi of left lower limb, including hip: Secondary | ICD-10-CM | POA: Diagnosis not present

## 2016-09-13 DIAGNOSIS — E2839 Other primary ovarian failure: Secondary | ICD-10-CM | POA: Diagnosis not present

## 2016-09-13 DIAGNOSIS — M8588 Other specified disorders of bone density and structure, other site: Secondary | ICD-10-CM | POA: Diagnosis not present

## 2016-09-19 DIAGNOSIS — Z1211 Encounter for screening for malignant neoplasm of colon: Secondary | ICD-10-CM | POA: Diagnosis not present

## 2016-11-08 DIAGNOSIS — L218 Other seborrheic dermatitis: Secondary | ICD-10-CM | POA: Diagnosis not present

## 2016-11-08 DIAGNOSIS — L245 Irritant contact dermatitis due to other chemical products: Secondary | ICD-10-CM | POA: Diagnosis not present

## 2016-11-22 DIAGNOSIS — H25013 Cortical age-related cataract, bilateral: Secondary | ICD-10-CM | POA: Diagnosis not present

## 2016-11-22 DIAGNOSIS — H40053 Ocular hypertension, bilateral: Secondary | ICD-10-CM | POA: Diagnosis not present

## 2016-11-22 DIAGNOSIS — H5213 Myopia, bilateral: Secondary | ICD-10-CM | POA: Diagnosis not present

## 2016-11-22 DIAGNOSIS — H40013 Open angle with borderline findings, low risk, bilateral: Secondary | ICD-10-CM | POA: Diagnosis not present

## 2017-01-19 DIAGNOSIS — M79644 Pain in right finger(s): Secondary | ICD-10-CM | POA: Diagnosis not present

## 2017-01-19 DIAGNOSIS — E7800 Pure hypercholesterolemia, unspecified: Secondary | ICD-10-CM | POA: Diagnosis not present

## 2017-01-19 DIAGNOSIS — R7301 Impaired fasting glucose: Secondary | ICD-10-CM | POA: Diagnosis not present

## 2017-01-19 DIAGNOSIS — I10 Essential (primary) hypertension: Secondary | ICD-10-CM | POA: Diagnosis not present

## 2017-01-19 DIAGNOSIS — E559 Vitamin D deficiency, unspecified: Secondary | ICD-10-CM | POA: Diagnosis not present

## 2017-02-16 DIAGNOSIS — Z1231 Encounter for screening mammogram for malignant neoplasm of breast: Secondary | ICD-10-CM | POA: Diagnosis not present

## 2017-02-17 DIAGNOSIS — I10 Essential (primary) hypertension: Secondary | ICD-10-CM | POA: Diagnosis not present

## 2017-02-27 DIAGNOSIS — I10 Essential (primary) hypertension: Secondary | ICD-10-CM | POA: Diagnosis not present

## 2017-03-06 DIAGNOSIS — M180 Bilateral primary osteoarthritis of first carpometacarpal joints: Secondary | ICD-10-CM | POA: Diagnosis not present

## 2017-03-06 DIAGNOSIS — M1811 Unilateral primary osteoarthritis of first carpometacarpal joint, right hand: Secondary | ICD-10-CM | POA: Diagnosis not present

## 2017-03-06 DIAGNOSIS — M1812 Unilateral primary osteoarthritis of first carpometacarpal joint, left hand: Secondary | ICD-10-CM | POA: Diagnosis not present

## 2017-03-06 DIAGNOSIS — M79644 Pain in right finger(s): Secondary | ICD-10-CM | POA: Diagnosis not present

## 2017-03-06 DIAGNOSIS — M79645 Pain in left finger(s): Secondary | ICD-10-CM | POA: Diagnosis not present

## 2017-03-06 DIAGNOSIS — M79641 Pain in right hand: Secondary | ICD-10-CM | POA: Diagnosis not present

## 2017-03-16 ENCOUNTER — Other Ambulatory Visit (HOSPITAL_COMMUNITY)
Admission: RE | Admit: 2017-03-16 | Discharge: 2017-03-16 | Disposition: A | Discharge status: Home / Self Care | Payer: Medicare Other | Source: Ambulatory Visit | Attending: Obstetrics and Gynecology | Admitting: Obstetrics and Gynecology

## 2017-03-16 ENCOUNTER — Other Ambulatory Visit: Payer: Self-pay | Admitting: Obstetrics and Gynecology

## 2017-03-16 DIAGNOSIS — Z124 Encounter for screening for malignant neoplasm of cervix: Secondary | ICD-10-CM | POA: Diagnosis not present

## 2017-03-16 DIAGNOSIS — R102 Pelvic and perineal pain: Secondary | ICD-10-CM | POA: Diagnosis not present

## 2017-03-16 DIAGNOSIS — Z01419 Encounter for gynecological examination (general) (routine) without abnormal findings: Secondary | ICD-10-CM | POA: Diagnosis not present

## 2017-03-17 LAB — CYTOLOGY - PAP
Diagnosis: NEGATIVE
HPV (WINDOPATH): NOT DETECTED

## 2017-03-30 DIAGNOSIS — I10 Essential (primary) hypertension: Secondary | ICD-10-CM | POA: Diagnosis not present

## 2017-03-30 DIAGNOSIS — R0789 Other chest pain: Secondary | ICD-10-CM | POA: Diagnosis not present

## 2017-04-11 DIAGNOSIS — I10 Essential (primary) hypertension: Secondary | ICD-10-CM | POA: Diagnosis not present

## 2017-04-11 DIAGNOSIS — Z0189 Encounter for other specified special examinations: Secondary | ICD-10-CM | POA: Diagnosis not present

## 2017-04-11 DIAGNOSIS — E7800 Pure hypercholesterolemia, unspecified: Secondary | ICD-10-CM | POA: Diagnosis not present

## 2017-04-11 DIAGNOSIS — R079 Chest pain, unspecified: Secondary | ICD-10-CM | POA: Diagnosis not present

## 2017-04-14 DIAGNOSIS — I10 Essential (primary) hypertension: Secondary | ICD-10-CM | POA: Diagnosis not present

## 2017-04-14 DIAGNOSIS — R0789 Other chest pain: Secondary | ICD-10-CM | POA: Diagnosis not present

## 2017-04-14 DIAGNOSIS — E7800 Pure hypercholesterolemia, unspecified: Secondary | ICD-10-CM | POA: Diagnosis not present

## 2017-04-27 DIAGNOSIS — I10 Essential (primary) hypertension: Secondary | ICD-10-CM | POA: Diagnosis not present

## 2017-04-27 DIAGNOSIS — R079 Chest pain, unspecified: Secondary | ICD-10-CM | POA: Diagnosis not present

## 2017-04-27 DIAGNOSIS — Z0189 Encounter for other specified special examinations: Secondary | ICD-10-CM | POA: Diagnosis not present

## 2017-04-27 DIAGNOSIS — E7800 Pure hypercholesterolemia, unspecified: Secondary | ICD-10-CM | POA: Diagnosis not present

## 2017-06-27 DIAGNOSIS — I10 Essential (primary) hypertension: Secondary | ICD-10-CM | POA: Diagnosis not present

## 2017-06-27 DIAGNOSIS — E7800 Pure hypercholesterolemia, unspecified: Secondary | ICD-10-CM | POA: Diagnosis not present

## 2017-06-27 DIAGNOSIS — R079 Chest pain, unspecified: Secondary | ICD-10-CM | POA: Diagnosis not present

## 2017-06-27 DIAGNOSIS — R002 Palpitations: Secondary | ICD-10-CM | POA: Diagnosis not present

## 2017-07-18 DIAGNOSIS — I10 Essential (primary) hypertension: Secondary | ICD-10-CM | POA: Diagnosis not present

## 2017-07-18 DIAGNOSIS — R002 Palpitations: Secondary | ICD-10-CM | POA: Diagnosis not present

## 2017-07-26 DIAGNOSIS — R002 Palpitations: Secondary | ICD-10-CM | POA: Diagnosis not present

## 2017-07-31 DIAGNOSIS — Z1389 Encounter for screening for other disorder: Secondary | ICD-10-CM | POA: Diagnosis not present

## 2017-07-31 DIAGNOSIS — I10 Essential (primary) hypertension: Secondary | ICD-10-CM | POA: Diagnosis not present

## 2017-07-31 DIAGNOSIS — Z0000 Encounter for general adult medical examination without abnormal findings: Secondary | ICD-10-CM | POA: Diagnosis not present

## 2017-07-31 DIAGNOSIS — E7800 Pure hypercholesterolemia, unspecified: Secondary | ICD-10-CM | POA: Diagnosis not present

## 2017-07-31 DIAGNOSIS — R634 Abnormal weight loss: Secondary | ICD-10-CM | POA: Diagnosis not present

## 2017-07-31 DIAGNOSIS — E559 Vitamin D deficiency, unspecified: Secondary | ICD-10-CM | POA: Diagnosis not present

## 2017-08-08 DIAGNOSIS — I10 Essential (primary) hypertension: Secondary | ICD-10-CM | POA: Diagnosis not present

## 2017-08-08 DIAGNOSIS — R002 Palpitations: Secondary | ICD-10-CM | POA: Diagnosis not present

## 2017-08-08 DIAGNOSIS — R0789 Other chest pain: Secondary | ICD-10-CM | POA: Diagnosis not present

## 2017-08-08 DIAGNOSIS — I341 Nonrheumatic mitral (valve) prolapse: Secondary | ICD-10-CM | POA: Diagnosis not present

## 2017-08-28 DIAGNOSIS — R634 Abnormal weight loss: Secondary | ICD-10-CM | POA: Diagnosis not present

## 2017-08-28 DIAGNOSIS — E7800 Pure hypercholesterolemia, unspecified: Secondary | ICD-10-CM | POA: Diagnosis not present

## 2017-08-28 DIAGNOSIS — R002 Palpitations: Secondary | ICD-10-CM | POA: Diagnosis not present

## 2017-10-17 DIAGNOSIS — H25013 Cortical age-related cataract, bilateral: Secondary | ICD-10-CM | POA: Diagnosis not present

## 2017-10-17 DIAGNOSIS — H40013 Open angle with borderline findings, low risk, bilateral: Secondary | ICD-10-CM | POA: Diagnosis not present

## 2017-10-17 DIAGNOSIS — H40053 Ocular hypertension, bilateral: Secondary | ICD-10-CM | POA: Diagnosis not present

## 2017-10-17 DIAGNOSIS — H5213 Myopia, bilateral: Secondary | ICD-10-CM | POA: Diagnosis not present

## 2017-11-06 DIAGNOSIS — E7800 Pure hypercholesterolemia, unspecified: Secondary | ICD-10-CM | POA: Diagnosis not present

## 2017-11-06 DIAGNOSIS — R002 Palpitations: Secondary | ICD-10-CM | POA: Diagnosis not present

## 2017-11-06 DIAGNOSIS — Z6822 Body mass index (BMI) 22.0-22.9, adult: Secondary | ICD-10-CM | POA: Diagnosis not present

## 2017-11-06 DIAGNOSIS — R634 Abnormal weight loss: Secondary | ICD-10-CM | POA: Diagnosis not present

## 2017-12-19 DIAGNOSIS — L218 Other seborrheic dermatitis: Secondary | ICD-10-CM | POA: Diagnosis not present

## 2017-12-19 DIAGNOSIS — L720 Epidermal cyst: Secondary | ICD-10-CM | POA: Diagnosis not present

## 2017-12-19 DIAGNOSIS — D2272 Melanocytic nevi of left lower limb, including hip: Secondary | ICD-10-CM | POA: Diagnosis not present

## 2017-12-19 DIAGNOSIS — D225 Melanocytic nevi of trunk: Secondary | ICD-10-CM | POA: Diagnosis not present

## 2018-01-12 DIAGNOSIS — Z23 Encounter for immunization: Secondary | ICD-10-CM | POA: Diagnosis not present

## 2018-02-08 MED FILL — SHINGRIX 50 MCG SUS: 50 | 1 days supply | Qty: 1 | Fill #0

## 2018-04-16 DIAGNOSIS — R002 Palpitations: Secondary | ICD-10-CM | POA: Diagnosis not present

## 2018-04-16 DIAGNOSIS — I10 Essential (primary) hypertension: Secondary | ICD-10-CM | POA: Diagnosis not present

## 2018-04-16 DIAGNOSIS — E7800 Pure hypercholesterolemia, unspecified: Secondary | ICD-10-CM | POA: Diagnosis not present

## 2018-04-16 DIAGNOSIS — R634 Abnormal weight loss: Secondary | ICD-10-CM | POA: Diagnosis not present

## 2018-05-25 MED FILL — SHINGRIX 50 MCG SUS: 50 | 1 days supply | Qty: 1 | Fill #0

## 2018-06-25 ENCOUNTER — Other Ambulatory Visit: Payer: Self-pay | Admitting: Cardiology

## 2018-06-25 DIAGNOSIS — I341 Nonrheumatic mitral (valve) prolapse: Secondary | ICD-10-CM

## 2018-06-25 DIAGNOSIS — I340 Nonrheumatic mitral (valve) insufficiency: Secondary | ICD-10-CM

## 2018-08-02 ENCOUNTER — Other Ambulatory Visit: Payer: Self-pay

## 2018-08-13 ENCOUNTER — Ambulatory Visit: Payer: Federal, State, Local not specified - PPO | Admitting: Cardiology

## 2018-08-20 DIAGNOSIS — Z1239 Encounter for other screening for malignant neoplasm of breast: Secondary | ICD-10-CM | POA: Diagnosis not present

## 2018-08-20 DIAGNOSIS — I10 Essential (primary) hypertension: Secondary | ICD-10-CM | POA: Diagnosis not present

## 2018-08-20 DIAGNOSIS — Z1211 Encounter for screening for malignant neoplasm of colon: Secondary | ICD-10-CM | POA: Diagnosis not present

## 2018-08-20 DIAGNOSIS — E7800 Pure hypercholesterolemia, unspecified: Secondary | ICD-10-CM | POA: Diagnosis not present

## 2018-09-04 ENCOUNTER — Other Ambulatory Visit: Payer: Self-pay

## 2018-09-06 ENCOUNTER — Other Ambulatory Visit: Payer: Self-pay

## 2018-09-06 ENCOUNTER — Ambulatory Visit (INDEPENDENT_AMBULATORY_CARE_PROVIDER_SITE_OTHER): Payer: Medicare Other

## 2018-09-06 DIAGNOSIS — I341 Nonrheumatic mitral (valve) prolapse: Secondary | ICD-10-CM | POA: Diagnosis not present

## 2018-09-06 DIAGNOSIS — I340 Nonrheumatic mitral (valve) insufficiency: Secondary | ICD-10-CM | POA: Diagnosis not present

## 2018-09-14 ENCOUNTER — Ambulatory Visit (INDEPENDENT_AMBULATORY_CARE_PROVIDER_SITE_OTHER): Payer: Medicare Other | Admitting: Cardiology

## 2018-09-14 ENCOUNTER — Encounter: Payer: Self-pay | Admitting: Cardiology

## 2018-09-14 ENCOUNTER — Other Ambulatory Visit: Payer: Self-pay

## 2018-09-14 VITALS — BP 183/95 | HR 117 | Ht 66.0 in | Wt 140.0 lb

## 2018-09-14 DIAGNOSIS — R002 Palpitations: Secondary | ICD-10-CM | POA: Diagnosis not present

## 2018-09-14 DIAGNOSIS — I10 Essential (primary) hypertension: Secondary | ICD-10-CM | POA: Insufficient documentation

## 2018-09-14 DIAGNOSIS — I341 Nonrheumatic mitral (valve) prolapse: Secondary | ICD-10-CM

## 2018-09-14 DIAGNOSIS — E7800 Pure hypercholesterolemia, unspecified: Secondary | ICD-10-CM

## 2018-09-14 NOTE — Progress Notes (Signed)
Virtual Visit via Video Note: This visit type was conducted due to national recommendations for restrictions regarding the COVID-19 Pandemic (e.g. social distancing).  This format is felt to be most appropriate for this patient at this time.  All issues noted in this document were discussed and addressed.  No physical exam was performed (except for noted visual exam findings with Telehealth visits).  The patient has consented to conduct a Telehealth visit and understands insurance will be billed.   I connected with@, on 09/14/18 at  by a video enabled telemedicine application and verified that I am speaking with the correct person using two identifiers.   I discussed the limitations of evaluation and management by telemedicine and the availability of in person appointments. The patient expressed understanding and agreed to proceed.   I have discussed with patient regarding the safety during COVID Pandemic and steps and precautions to be taken including social distancing, frequent hand wash and use of detergent soap, gels with the patient. I asked the patient to avoid touching mouth, nose, eyes, ears with the hands. I encouraged regular walking around the neighborhood and exercise and regular diet, as long as social distancing can be maintained.  Primary Physician/Referring:  Leeroy Cha, MD  Patient ID: Kelly Reyes, female    DOB: October 26, 1945, 73 y.o.   MRN: [id number]  Chief Complaint  Patient presents with  . Hypertension  . Follow-up    65yr    HPI: Kelly Reyes  is a 73 y.o. female  with chronic palpitation,"skipping, fluttering". Stress nuclear scans were negative for ischemia in December 2018.  No complaints of shortness of breath, orthopnea or PND. Patient has hypertension, hypercholesterolemia, mild hyperglycemia. She does not smoke. No history of thyroid problems. This is her annual visit.  She still has occasional palpitation with no other associated  symptoms.  She has noticed occasional leg edema especially when she travels.  Past Medical History:  Diagnosis Date  . Hyperlipidemia     History reviewed. No pertinent surgical history.  Social History   Socioeconomic History  . Marital status: Married    Spouse name: Not on file  . Number of children: 2  . Years of education: Not on file  . Highest education level: Not on file  Occupational History  . Not on file  Social Needs  . Financial resource strain: Not on file  . Food insecurity:    Worry: Not on file    Inability: Not on file  . Transportation needs:    Medical: Not on file    Non-medical: Not on file  Tobacco Use  . Smoking status: Never Smoker  . Smokeless tobacco: Never Used  Substance and Sexual Activity  . Alcohol use: Not Currently  . Drug use: Not on file  . Sexual activity: Not on file  Lifestyle  . Physical activity:    Days per week: Not on file    Minutes per session: Not on file  . Stress: Not on file  Relationships  . Social connections:    Talks on phone: Not on file    Gets together: Not on file    Attends religious service: Not on file    Active member of club or organization: Not on file    Attends meetings of clubs or organizations: Not on file    Relationship status: Not on file  . Intimate partner violence:    Fear of current or ex partner: Not on file    Emotionally  abused: Not on file    Physically abused: Not on file    Forced sexual activity: Not on file  Other Topics Concern  . Not on file  Social History Narrative  . Not on file    Review of Systems  Constitution: Negative for chills, decreased appetite, malaise/fatigue and weight gain.  Cardiovascular: Positive for leg swelling (after travel) and palpitations. Negative for dyspnea on exertion and syncope.  Endocrine: Negative for cold intolerance.  Hematologic/Lymphatic: Does not bruise/bleed easily.  Musculoskeletal: Negative for joint swelling.  Gastrointestinal:  Negative for abdominal pain, anorexia, change in bowel habit, hematochezia and melena.  Neurological: Negative for headaches and light-headedness.  Psychiatric/Behavioral: Negative for depression and substance abuse.  All other systems reviewed and are negative.     Objective  Blood pressure (!) 183/95, pulse (!) 117, height 5\' 6"  (1.676 m), weight 140 lb (63.5 kg). Body mass index is 22.6 kg/m.    Physical Exam  Constitutional: She appears well-developed and well-nourished. No distress.  HENT:  Head: Atraumatic.  Eyes: Conjunctivae are normal.  Neck: Neck supple. No JVD present. No thyromegaly present.  Cardiovascular: Normal rate, regular rhythm, normal heart sounds and intact distal pulses. Exam reveals no gallop.  No murmur heard. Pulmonary/Chest: Effort normal and breath sounds normal.  Abdominal: Soft. Bowel sounds are normal.  Musculoskeletal: Normal range of motion.  Neurological: She is alert.  Skin: Skin is warm and dry.  Psychiatric: She has a normal mood and affect.   Radiology: No results found.  Laboratory examination:   08/28/2017: TC 194, TG 66, HDL 58, LDL 123, Hb 14.7 Plt 235. BUN 23, Cr 1.15 Normal LFT.   PRN Meds:. Medications Discontinued During This Encounter  Medication Reason  . atovaquone-proguanil (MALARONE) 250-100 MG TABS tablet Completed Course  . azithromycin (ZITHROMAX) 500 MG tablet Completed Course  . meclizine (ANTIVERT) 32 MG tablet Completed Course   Current Meds  Medication Sig  . cholecalciferol (VITAMIN D3) 25 MCG (1000 UT) tablet Take 1,000 Units by mouth daily.  Marland Kitchen ezetimibe (ZETIA) 10 MG tablet TAKE ONE TABLET BY MOUTH ONE TIME DAILY  . telmisartan-hydrochlorothiazide (MICARDIS HCT) 40-12.5 MG tablet TAKE 1 TABLET BY MOUTH ONCE DAILY    Cardiac Studies:    Echocardiogram 07/18/2017: Left ventricle cavity is normal in size. Normal global wall motion. Normal diastolic filling pattern, normal LAP. Calculated EF 65%. Trace  aortic regurgitation. No significant myxomatous degeneration noted. Mild prolapse of the mitral valve leaflets. Mild (Grade I) mitral regurgitation. Mild tricuspid regurgitation. Borderline increase in pulmonary artery pressure at 32 mm Hg. Insignificant pericardial effusion.  Exercise myoview stress 04/14/2017:  1. The patient performed treadmill exercise using a Modified Bruce protocol, completing 9:59 minutes. The patient completed an estimated workload of 7.49 METS.  Normal HR and blood response. Exercise capacity fair for age.  2. No ischemic changes on stress EKG. 3. Gated SPECT images reveal normal myocardial thickening and wall motion.  The left ventricular ejection fraction was calculated or visually estimated to be 72%.   REST and STRESS images demonstrate decreased tracer uptake in the basal inferoseptal and mid inferoseptal segments of the left ventricle.  These defects are related to breast attenuation, imaging performed in sitting position.  4. This is a low risk study.  Assessment   Mitral valve prolapse  Essential hypertension  Hypercholesteremia  EKG- 06/27/2017 - Sinus rhythm, low voltage in chest leads  Recommendations:   Patient on annual visit for mitral valve prolapse and palpitations, I have discussed  with her again regarding the echocardiogram which reveals very mild mitral valve prolapse without significant myxomatous degeneration.  No endocarditis prophylaxis is indicated and I have discussed this with the patient.  Blood pressure was markedly elevated today however patient has had multiple home recordings that are perfectly well controlled.  Hence advised her to keep a track of the same.  With regard to hyperlipidemia, she is presently on Zetia, given no other significant risk factors, could potentially consider adding pravastatin 20 mg to get LDL goal to less than 100.  With regard to palpitations, advised her that she can certainly continue to use metoprolol  tartrate 12.5 mg twice daily as needed.  Symptoms suggest occasional PACs and PVCs.  This was a 40-minute office visit encounter/virtual visit with patient face-to-face encounter of >50% with multiple questions and discussions regarding her medical issues.  I also reviewed her labs that were performed last year including renal function and lipids.  She will probably benefit from obtaining a TSH.  As she is remained stable from cardiac standpoint I will see her back on a as needed basis.  Adrian Prows, MD, [facility] 09/14/2018, 11:12 AM [facility]. [place] Pager: [phone] Office: [phone] If no answer Cell [phone]

## 2018-09-27 DIAGNOSIS — Z1231 Encounter for screening mammogram for malignant neoplasm of breast: Secondary | ICD-10-CM | POA: Diagnosis not present

## 2018-10-23 DIAGNOSIS — H40023 Open angle with borderline findings, high risk, bilateral: Secondary | ICD-10-CM | POA: Diagnosis not present

## 2018-10-23 DIAGNOSIS — H40053 Ocular hypertension, bilateral: Secondary | ICD-10-CM | POA: Diagnosis not present

## 2018-10-23 DIAGNOSIS — H5213 Myopia, bilateral: Secondary | ICD-10-CM | POA: Diagnosis not present

## 2018-11-05 DIAGNOSIS — D2239 Melanocytic nevi of other parts of face: Secondary | ICD-10-CM | POA: Diagnosis not present

## 2018-11-05 DIAGNOSIS — L720 Epidermal cyst: Secondary | ICD-10-CM | POA: Diagnosis not present

## 2018-11-05 DIAGNOSIS — D225 Melanocytic nevi of trunk: Secondary | ICD-10-CM | POA: Diagnosis not present

## 2018-11-05 DIAGNOSIS — D2371 Other benign neoplasm of skin of right lower limb, including hip: Secondary | ICD-10-CM | POA: Diagnosis not present

## 2018-11-05 DIAGNOSIS — L218 Other seborrheic dermatitis: Secondary | ICD-10-CM | POA: Diagnosis not present

## 2018-11-05 DIAGNOSIS — D2272 Melanocytic nevi of left lower limb, including hip: Secondary | ICD-10-CM | POA: Diagnosis not present

## 2018-11-27 DIAGNOSIS — M1610 Unilateral primary osteoarthritis, unspecified hip: Secondary | ICD-10-CM | POA: Diagnosis not present

## 2018-11-27 DIAGNOSIS — E7800 Pure hypercholesterolemia, unspecified: Secondary | ICD-10-CM | POA: Diagnosis not present

## 2018-11-27 DIAGNOSIS — R319 Hematuria, unspecified: Secondary | ICD-10-CM | POA: Diagnosis not present

## 2018-11-27 DIAGNOSIS — I10 Essential (primary) hypertension: Secondary | ICD-10-CM | POA: Diagnosis not present

## 2018-11-27 DIAGNOSIS — R002 Palpitations: Secondary | ICD-10-CM | POA: Diagnosis not present

## 2018-11-27 DIAGNOSIS — N183 Chronic kidney disease, stage 3 (moderate): Secondary | ICD-10-CM | POA: Diagnosis not present

## 2019-02-28 DIAGNOSIS — E7800 Pure hypercholesterolemia, unspecified: Secondary | ICD-10-CM | POA: Diagnosis not present

## 2019-02-28 DIAGNOSIS — I10 Essential (primary) hypertension: Secondary | ICD-10-CM | POA: Diagnosis not present

## 2019-02-28 DIAGNOSIS — R319 Hematuria, unspecified: Secondary | ICD-10-CM | POA: Diagnosis not present

## 2019-03-19 DIAGNOSIS — Z01419 Encounter for gynecological examination (general) (routine) without abnormal findings: Secondary | ICD-10-CM | POA: Diagnosis not present

## 2019-09-20 ENCOUNTER — Ambulatory Visit: Payer: Federal, State, Local not specified - PPO | Admitting: Cardiology

## 2019-09-21 NOTE — Progress Notes (Signed)
Primary Physician/Referring:  Leeroy Cha, MD  Patient ID: Kelly Reyes, female    DOB: 01-21-1946, 74 y.o.   MRN: [id number]  Chief Complaint  Patient presents with  . Mitral Valve Prolapse  . Follow-up    1 year   HPI:    Kelly Reyes  is a 74 y.o.female  with chronic palpitation,"skipping, fluttering", mitral valve prolapse with moderate mitral regurgitation, essential hypertension, mild hyperlipidemia presents here for annual visit.  Except for occasional palpitation and if she stands up for long period of time she does feel mild leg edema she has no other specific complaints.  Past Medical History:  Diagnosis Date  . Hyperlipidemia    History reviewed. No pertinent surgical history. Family History  Problem Relation Age of Onset  . Stroke Mother   . Syncope episode Mother   . Heart attack Father   . Diabetes Father   . Diabetes Brother     Social History   Tobacco Use  . Smoking status: Never Smoker  . Smokeless tobacco: Never Used  Substance Use Topics  . Alcohol use: Not Currently   Marital Status: Married  ROS  Review of Systems  Cardiovascular: Positive for leg swelling (after travel) and palpitations. Negative for chest pain, dyspnea on exertion and syncope.  Gastrointestinal: Negative for melena.   Objective  Blood pressure 138/89, pulse 80, temperature 98 F (36.7 C), temperature source Temporal, resp. rate 16, height 5\' 6"  (1.676 m), weight 135 lb 9.6 oz (61.5 kg), SpO2 100 %.  Vitals with BMI 09/23/2019 09/14/2018  Height 5\' 6"  5\' 6"   Weight 135 lbs 10 oz 140 lbs  BMI [id number]  Systolic 0000000 [id number]  Diastolic 89 95  Pulse 80 [id number]     Physical Exam  Constitutional: She appears well-developed and well-nourished. No distress.  Cardiovascular: Normal rate, regular rhythm and intact distal pulses. Exam reveals no gallop.  Murmur heard.  Crescendo-decrescendo mid to late systolic murmur is present with a grade of 2/6 at the apex.  Mid systolic click heard Pulmonary/Chest: Effort normal and breath sounds normal.  Abdominal: Soft. Bowel sounds are normal.  Psychiatric: She has a normal mood and affect.   Laboratory examination:   Lipid Panel completed 08/22/2019 Cholesterol, total 179.000 08/22/2019 Triglycerides 41.000 08/22/2019 HDL 58.000 08/22/2019 LDL-C 113.000 08/22/2019  Glucose Random 93.000 08/22/2019  BUN 25.000 08/22/2019 Creatinine, Serum 1.180 08/22/2019  08/28/2017: TC 194, TG 66, HDL 58, LDL 123, Hb 14.7 Plt 235. BUN 23, Cr 1.15 Normal LFT.  TSH 0.620 07/31/2017  A1C 5.600 01/19/2017  Medications and allergies   Allergies  Allergen Reactions  . Codeine Nausea Only     Current Outpatient Medications  Medication Instructions  . amLODipine (NORVASC) 2.5 mg, Oral, Daily  . cholecalciferol (VITAMIN D3) 1,000 Units, Oral, Daily  . ezetimibe (ZETIA) 10 MG tablet TAKE ONE TABLET BY MOUTH ONE TIME DAILY  . metoprolol tartrate (LOPRESSOR) 12.5 mg, Oral, 2 times daily PRN  . Multiple Vitamin (MULTIVITAMIN) capsule Oral, Daily  . rosuvastatin (CRESTOR) 5 mg, Oral, Daily  . telmisartan-hydrochlorothiazide (MICARDIS HCT) 40-12.5 MG tablet TAKE 1 TABLET BY MOUTH ONCE DAILY   Radiology:   No results found.  Cardiac Studies:   Echocardiogram 09/06/2018:  Left ventricle cavity is normal in size. Normal left ventricular wall thickness. Normal global wall motion. Doppler evidence of grade I  (impaired) diastolic dysfunction, normal LAP. Calculated EF 63%.  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  Trace aortic regurgitation.  Mild mitral valve  prolapse with symmetric leaflets. Moderate (Grade II) mitral regurgitation.  Mild to moderate tricuspid regurgitation. Estimated pulmonary artery systolic pressure 30 mmHg.  No significant change compared to previous study on 07/18/2017. Marland Kitchen  Exercise myoview stress 04/14/2017:  1. The patient performed treadmill exercise using a Modified Bruce protocol,  completing 9:59 minutes. The patient completed an estimated workload of 7.49 METS.  Normal HR and blood response. Exercise capacity fair for age.  2. No ischemic changes on stress EKG. 3. Gated SPECT images reveal normal myocardial thickening and wall motion.  The left ventricular ejection fraction was calculated or visually estimated to be 72%.   REST and STRESS images demonstrate decreased tracer uptake in the basal inferoseptal and mid inferoseptal segments of the left ventricle.  These defects are related to breast attenuation, imaging performed in sitting position.  4. This is a low risk study.  EKG  EKG 09/23/2019: Normal sinus rhythm with rate of 92 bpm, borderline criteria for left atrial enlargement, otherwise normal EKG.  Borderline low voltage complexes.  No significant change from 06/27/2017.   Assessment     ICD-10-CM   1. Mitral valve prolapse  I34.1 EKG 12-Lead  2. Moderate mitral regurgitation  I34.0   3. Essential hypertension  I10   4. Hypercholesteremia  E78.00 rosuvastatin (CRESTOR) 5 MG tablet    Lipid Panel With LDL/HDL Ratio    Meds ordered this encounter  Medications  . rosuvastatin (CRESTOR) 5 MG tablet    Sig: Take 1 tablet (5 mg total) by mouth daily.    Dispense:  30 tablet    Refill:  2    There are no discontinued medications.  Recommendations:   Kelly Reyes  is a 74 y.o.  female  with chronic palpitation,"skipping, fluttering", mitral valve prolapse with moderate mitral regurgitation, essential hypertension, mild hyperlipidemia presents here for annual visit.  She remains asymptomatic with regard to mitral valve prolapse and moderate mitral regurgitation without clinical evidence of heart failure.  Blood pressure is elevated today, states that her blood pressure has been well controlled at home and she does get indeed nervous when she sees physicians.  Advised her to keep tabs of her blood pressure at home and to bring those recordings to  [place].  With regard to hyperlipidemia, I reviewed external labs, LDL is not at goal.  She has been skeptical about using statins, but after discussions she is willing to try 5 mg of Crestor.  Will obtain lipid profile testing in 2 months.  She can have refills from her PCP on her next visit sometime in October/November 2021 and until then I will continue to give her the medication refills.  I will see her back in 2 years unless she develops worsening dyspnea, palpitations or murmur is changed in intensity by physical examination by her PCP.  Kelly Prows, MD, [facility] 09/23/2019, 9:52 AM [facility] Pager: [phone] Office: [phone]

## 2019-09-23 ENCOUNTER — Encounter: Payer: Self-pay | Admitting: Cardiology

## 2019-09-23 ENCOUNTER — Other Ambulatory Visit: Payer: Self-pay | Admitting: Cardiology

## 2019-09-23 ENCOUNTER — Other Ambulatory Visit: Payer: Self-pay

## 2019-09-23 ENCOUNTER — Ambulatory Visit: Payer: Medicare Other | Admitting: Cardiology

## 2019-09-23 VITALS — BP 138/89 | HR 80 | Temp 98.0°F | Resp 16 | Ht 66.0 in | Wt 135.6 lb

## 2019-09-23 DIAGNOSIS — I341 Nonrheumatic mitral (valve) prolapse: Secondary | ICD-10-CM

## 2019-09-23 DIAGNOSIS — I10 Essential (primary) hypertension: Secondary | ICD-10-CM

## 2019-09-23 DIAGNOSIS — I340 Nonrheumatic mitral (valve) insufficiency: Secondary | ICD-10-CM

## 2019-09-23 DIAGNOSIS — E7800 Pure hypercholesterolemia, unspecified: Secondary | ICD-10-CM

## 2019-09-23 MED ORDER — ROSUVASTATIN CALCIUM 5 MG PO TABS: 5.0000 mg | ORAL_TABLET | Freq: Every day | ORAL | 2 refills | Status: DC

## 2020-03-13 ENCOUNTER — Ambulatory Visit: Payer: Medicare Other | Attending: Internal Medicine

## 2020-03-13 ENCOUNTER — Other Ambulatory Visit (HOSPITAL_BASED_OUTPATIENT_CLINIC_OR_DEPARTMENT_OTHER): Payer: Self-pay | Admitting: Internal Medicine

## 2020-03-13 DIAGNOSIS — Z23 Encounter for immunization: Secondary | ICD-10-CM

## 2020-03-13 NOTE — Progress Notes (Signed)
° °  Covid-19 Vaccination Clinic  Name:  Kelly Reyes    MRN: [id number] DOB: 1945/12/27  03/13/2020  [name] was observed post Covid-19 immunization for 15 minutes without incident. She was provided with Vaccine Information Sheet and instruction to access the V-Safe system.   [name] was instructed to call 911 with any severe reactions post vaccine:  Difficulty breathing   Swelling of face and throat   A fast heartbeat   A bad rash all over body   Dizziness and weakness

## 2020-03-16 MED FILL — PFIZER-BIONTECH COVID-19 VA: 30 | 1 days supply | Qty: 0 | Fill #0

## 2020-04-07 DIAGNOSIS — N183 Chronic kidney disease, stage 3 unspecified: Secondary | ICD-10-CM | POA: Diagnosis not present

## 2020-04-07 DIAGNOSIS — I10 Essential (primary) hypertension: Secondary | ICD-10-CM | POA: Diagnosis not present

## 2020-08-24 DIAGNOSIS — Z0000 Encounter for general adult medical examination without abnormal findings: Secondary | ICD-10-CM | POA: Diagnosis not present

## 2020-08-24 DIAGNOSIS — M1610 Unilateral primary osteoarthritis, unspecified hip: Secondary | ICD-10-CM | POA: Diagnosis not present

## 2020-08-24 DIAGNOSIS — I080 Rheumatic disorders of both mitral and aortic valves: Secondary | ICD-10-CM | POA: Diagnosis not present

## 2020-08-24 DIAGNOSIS — N183 Chronic kidney disease, stage 3 unspecified: Secondary | ICD-10-CM | POA: Diagnosis not present

## 2020-08-24 DIAGNOSIS — Z1389 Encounter for screening for other disorder: Secondary | ICD-10-CM | POA: Diagnosis not present

## 2020-08-24 DIAGNOSIS — Z86010 Personal history of colonic polyps: Secondary | ICD-10-CM | POA: Diagnosis not present

## 2020-08-24 DIAGNOSIS — Z7189 Other specified counseling: Secondary | ICD-10-CM | POA: Diagnosis not present

## 2020-08-24 DIAGNOSIS — M85859 Other specified disorders of bone density and structure, unspecified thigh: Secondary | ICD-10-CM | POA: Diagnosis not present

## 2020-08-24 DIAGNOSIS — I10 Essential (primary) hypertension: Secondary | ICD-10-CM | POA: Diagnosis not present

## 2020-08-24 DIAGNOSIS — E7800 Pure hypercholesterolemia, unspecified: Secondary | ICD-10-CM | POA: Diagnosis not present

## 2020-08-31 DIAGNOSIS — Z780 Asymptomatic menopausal state: Secondary | ICD-10-CM | POA: Diagnosis not present

## 2020-09-09 ENCOUNTER — Ambulatory Visit: Payer: Medicare Other | Attending: Internal Medicine

## 2020-09-09 DIAGNOSIS — Z23 Encounter for immunization: Secondary | ICD-10-CM

## 2020-09-09 NOTE — Progress Notes (Signed)
   Covid-19 Vaccination Clinic  Name:  Kelly Reyes    MRN: [id number] DOB: 02-02-1946  09/09/2020  Ms. Kelly Reyes was observed post Covid-19 immunization for 15 minutes without incident. She was provided with Vaccine Information Sheet and instruction to access the V-Safe system.   Ms. Kelly Reyes was instructed to call 911 with any severe reactions post vaccine: Marland Kitchen Difficulty breathing  . Swelling of face and throat  . A fast heartbeat  . A bad rash all over body  . Dizziness and weakness   Immunizations Administered    Name Date Dose VIS Date Route   PFIZER Comrnaty(Gray TOP) Covid-19 Vaccine 09/09/2020 11:40 AM 0.3 mL 04/09/2020 Intramuscular   Manufacturer: [employer]   Lot: [id number]   NDC: [phone]

## 2020-09-14 ENCOUNTER — Other Ambulatory Visit (HOSPITAL_COMMUNITY): Payer: Self-pay

## 2020-09-14 MED ORDER — COVID-19 MRNA VAC-TRIS(PFIZER) 30 MCG/0.3ML IM SUSP
INTRAMUSCULAR | 0 refills | Status: DC
  Filled 2020-09-14: qty 0.3, 17d supply, fill #0

## 2020-11-16 DIAGNOSIS — H40013 Open angle with borderline findings, low risk, bilateral: Secondary | ICD-10-CM | POA: Diagnosis not present

## 2020-11-16 DIAGNOSIS — H5213 Myopia, bilateral: Secondary | ICD-10-CM | POA: Diagnosis not present

## 2020-11-16 DIAGNOSIS — H2513 Age-related nuclear cataract, bilateral: Secondary | ICD-10-CM | POA: Diagnosis not present

## 2020-11-16 DIAGNOSIS — H40053 Ocular hypertension, bilateral: Secondary | ICD-10-CM | POA: Diagnosis not present

## 2020-12-09 DIAGNOSIS — L218 Other seborrheic dermatitis: Secondary | ICD-10-CM | POA: Diagnosis not present

## 2020-12-09 DIAGNOSIS — L720 Epidermal cyst: Secondary | ICD-10-CM | POA: Diagnosis not present

## 2020-12-09 DIAGNOSIS — D225 Melanocytic nevi of trunk: Secondary | ICD-10-CM | POA: Diagnosis not present

## 2020-12-09 DIAGNOSIS — D1801 Hemangioma of skin and subcutaneous tissue: Secondary | ICD-10-CM | POA: Diagnosis not present

## 2020-12-09 DIAGNOSIS — D2239 Melanocytic nevi of other parts of face: Secondary | ICD-10-CM | POA: Diagnosis not present

## 2020-12-09 DIAGNOSIS — L819 Disorder of pigmentation, unspecified: Secondary | ICD-10-CM | POA: Diagnosis not present

## 2020-12-09 DIAGNOSIS — D2272 Melanocytic nevi of left lower limb, including hip: Secondary | ICD-10-CM | POA: Diagnosis not present

## 2021-01-28 DIAGNOSIS — Z1231 Encounter for screening mammogram for malignant neoplasm of breast: Secondary | ICD-10-CM | POA: Diagnosis not present

## 2021-02-23 DIAGNOSIS — Z23 Encounter for immunization: Secondary | ICD-10-CM | POA: Diagnosis not present

## 2021-02-23 DIAGNOSIS — I080 Rheumatic disorders of both mitral and aortic valves: Secondary | ICD-10-CM | POA: Diagnosis not present

## 2021-02-23 DIAGNOSIS — I10 Essential (primary) hypertension: Secondary | ICD-10-CM | POA: Diagnosis not present

## 2021-02-23 DIAGNOSIS — E7800 Pure hypercholesterolemia, unspecified: Secondary | ICD-10-CM | POA: Diagnosis not present

## 2021-02-23 DIAGNOSIS — N183 Chronic kidney disease, stage 3 unspecified: Secondary | ICD-10-CM | POA: Diagnosis not present

## 2021-02-23 DIAGNOSIS — Z86010 Personal history of colonic polyps: Secondary | ICD-10-CM | POA: Diagnosis not present

## 2021-03-18 DIAGNOSIS — Z01419 Encounter for gynecological examination (general) (routine) without abnormal findings: Secondary | ICD-10-CM | POA: Diagnosis not present

## 2021-04-06 ENCOUNTER — Other Ambulatory Visit (HOSPITAL_BASED_OUTPATIENT_CLINIC_OR_DEPARTMENT_OTHER): Payer: Self-pay

## 2021-04-06 ENCOUNTER — Ambulatory Visit: Payer: Medicare Other | Attending: Internal Medicine

## 2021-04-06 DIAGNOSIS — Z23 Encounter for immunization: Secondary | ICD-10-CM

## 2021-04-06 MED ORDER — PFIZER COVID-19 VAC BIVALENT 30 MCG/0.3ML IM SUSP
INTRAMUSCULAR | 0 refills | Status: DC
  Filled 2021-04-06: qty 0.3, 1d supply, fill #0

## 2021-05-19 DIAGNOSIS — D120 Benign neoplasm of cecum: Secondary | ICD-10-CM | POA: Diagnosis not present

## 2021-05-19 DIAGNOSIS — D123 Benign neoplasm of transverse colon: Secondary | ICD-10-CM | POA: Diagnosis not present

## 2021-05-19 DIAGNOSIS — Z86010 Personal history of colonic polyps: Secondary | ICD-10-CM | POA: Diagnosis not present

## 2021-05-21 DIAGNOSIS — D123 Benign neoplasm of transverse colon: Secondary | ICD-10-CM | POA: Diagnosis not present

## 2021-05-21 DIAGNOSIS — D120 Benign neoplasm of cecum: Secondary | ICD-10-CM | POA: Diagnosis not present

## 2021-08-27 ENCOUNTER — Ambulatory Visit
Admission: RE | Admit: 2021-08-27 | Discharge: 2021-08-27 | Disposition: A | Discharge status: Home / Self Care | Payer: Medicare Other | Source: Ambulatory Visit | Attending: Internal Medicine | Admitting: Internal Medicine

## 2021-08-27 ENCOUNTER — Other Ambulatory Visit: Payer: Self-pay | Admitting: Internal Medicine

## 2021-08-27 DIAGNOSIS — M1610 Unilateral primary osteoarthritis, unspecified hip: Secondary | ICD-10-CM | POA: Diagnosis not present

## 2021-08-27 DIAGNOSIS — N183 Chronic kidney disease, stage 3 unspecified: Secondary | ICD-10-CM | POA: Diagnosis not present

## 2021-08-27 DIAGNOSIS — Z1389 Encounter for screening for other disorder: Secondary | ICD-10-CM | POA: Diagnosis not present

## 2021-08-27 DIAGNOSIS — I080 Rheumatic disorders of both mitral and aortic valves: Secondary | ICD-10-CM | POA: Diagnosis not present

## 2021-08-27 DIAGNOSIS — I10 Essential (primary) hypertension: Secondary | ICD-10-CM | POA: Diagnosis not present

## 2021-08-27 DIAGNOSIS — Z1159 Encounter for screening for other viral diseases: Secondary | ICD-10-CM | POA: Diagnosis not present

## 2021-08-27 DIAGNOSIS — E7800 Pure hypercholesterolemia, unspecified: Secondary | ICD-10-CM | POA: Diagnosis not present

## 2021-08-27 DIAGNOSIS — Z0000 Encounter for general adult medical examination without abnormal findings: Secondary | ICD-10-CM | POA: Diagnosis not present

## 2021-08-27 DIAGNOSIS — M1611 Unilateral primary osteoarthritis, right hip: Secondary | ICD-10-CM

## 2021-09-06 DIAGNOSIS — M1611 Unilateral primary osteoarthritis, right hip: Secondary | ICD-10-CM | POA: Diagnosis not present

## 2021-09-06 DIAGNOSIS — M25551 Pain in right hip: Secondary | ICD-10-CM | POA: Diagnosis not present

## 2021-09-23 ENCOUNTER — Ambulatory Visit: Payer: Medicare Other | Admitting: Cardiology

## 2021-09-23 ENCOUNTER — Encounter: Payer: Self-pay | Admitting: Cardiology

## 2021-09-23 VITALS — BP 130/89 | HR 83 | Temp 97.6°F | Resp 16 | Ht 66.0 in | Wt 124.6 lb

## 2021-09-23 DIAGNOSIS — I341 Nonrheumatic mitral (valve) prolapse: Secondary | ICD-10-CM

## 2021-09-23 DIAGNOSIS — N1831 Chronic kidney disease, stage 3a: Secondary | ICD-10-CM

## 2021-09-23 DIAGNOSIS — E7800 Pure hypercholesterolemia, unspecified: Secondary | ICD-10-CM

## 2021-09-23 DIAGNOSIS — I10 Essential (primary) hypertension: Secondary | ICD-10-CM

## 2021-09-23 MED ORDER — AMLODIPINE BESYLATE 10 MG PO TABS: 10.0000 mg | ORAL_TABLET | Freq: Every day | ORAL | 0 refills | Status: DC

## 2021-09-23 MED ORDER — AMLODIPINE BESYLATE 5 MG PO TABS: 5.0000 mg | ORAL_TABLET | Freq: Every day | ORAL | 0 refills | Status: DC

## 2021-09-23 MED ORDER — EZETIMIBE 10 MG PO TABS: 10.0000 mg | ORAL_TABLET | Freq: Every day | ORAL | 3 refills | Status: DC

## 2021-09-23 NOTE — Addendum Note (Signed)
Addended by: Kela Millin on: 09/23/2021 01:40 PM   Modules accepted: Orders

## 2021-09-23 NOTE — Progress Notes (Addendum)
Primary Physician/Referring:  Leeroy Cha, MD  Patient ID: Kelly Reyes, female    DOB: 06/23/1945, 76 y.o.   MRN: [id number]  Chief Complaint  Patient presents with   Mitral Regurgitation   Mitral Valve Prolapse   Follow-up    2 years   HPI:    Kelly Reyes  is a 76 y.o.ffemale  with chronic stable palpitation,"skipping, fluttering", mitral valve prolapse with moderate mitral regurgitation, essential hypertension, chronic stage 3a CKD, mild hyperlipidemia presents here for 2 year follow up. She remains asymptomatic except for occasional palpitations that are brief.  Past Medical History:  Diagnosis Date   Hyperlipidemia    Hypertension    History reviewed. No pertinent surgical history. Family History  Problem Relation Age of Onset   Stroke Mother    Syncope episode Mother    Heart attack Father    Diabetes Father    Diabetes Brother     Social History   Tobacco Use   Smoking status: Never   Smokeless tobacco: Never  Substance Use Topics   Alcohol use: Not Currently   Marital Status: Married  ROS  Review of Systems  Cardiovascular:  Positive for palpitations. Negative for chest pain, dyspnea on exertion, leg swelling and syncope.  Gastrointestinal:  Negative for melena.  Objective  Blood pressure 130/89, pulse 83, temperature 97.6 F (36.4 C), temperature source Temporal, resp. rate 16, height $RemoveBe'5\' 6"'uskvMzjus$  (1.676 m), weight 124 lb 9.6 oz (56.5 kg), SpO2 97 %.     09/23/2021   10:13 AM 09/23/2021   10:12 AM 09/23/2019    9:09 AM  Vitals with BMI  Height  $Remov'5\' 6"'MlRxkM$  $Remove'5\' 6"'rAnXqne$   Weight  124 lbs 10 oz 135 lbs 10 oz  BMI  94.80 16.5  Systolic 537 482 707  Diastolic 89 83 89  Pulse 83 88 80     Physical Exam Constitutional:      Appearance: She is well-developed.  Neck:     Vascular: No carotid bruit or JVD.  Cardiovascular:     Rate and Rhythm: Normal rate and regular rhythm.     Pulses: Intact distal pulses.     Heart sounds: A midsystolic click.  Murmur heard.  Mid to late systolic murmur is present with a grade of 2/6 at the apex. Mid systolic click heard    No gallop.  Pulmonary:     Effort: Pulmonary effort is normal.     Breath sounds: Normal breath sounds.  Abdominal:     General: Bowel sounds are normal.     Palpations: Abdomen is soft.  Musculoskeletal:     Right lower leg: No edema.     Left lower leg: No edema.   Laboratory examination:   Labs 08/27/2021:  Total cholesterol 216, triglycerides 74, HDL 60, LDL 144.  BUN 23, creatinine 1.24, EGFR 46 mL, potassium 3.8.  LFTs normal.  Hb 13.9/HCT 40.6, platelets 246, normal indicis.  TSH 0.620 07/31/2017  Medications and allergies   Allergies  Allergen Reactions   Codeine Nausea Only    Current Outpatient Medications:    cholecalciferol (VITAMIN D3) 25 MCG (1000 UT) tablet, Take 1,000 Units by mouth daily., Disp: , Rfl:    ezetimibe (ZETIA) 10 MG tablet, Take 1 tablet (10 mg total) by mouth daily. Restart Medication, Disp: 90 tablet, Rfl: 3   Multiple Vitamin (MULTIVITAMIN) capsule, Take by mouth daily., Disp: , Rfl:    rosuvastatin (CRESTOR) 10 MG tablet, Take 1 tablet by mouth at bedtime.,  Disp: , Rfl:    telmisartan-hydrochlorothiazide (MICARDIS HCT) 40-12.5 MG tablet, TAKE 1 TABLET BY MOUTH ONCE DAILY, Disp: , Rfl:    amLODipine (NORVASC) 10 MG tablet, Take 1 tablet (10 mg total) by mouth daily., Disp: 90 tablet, Rfl: 0  Radiology:   No results found.  Cardiac Studies:   Echocardiogram 09/06/2018:  Left ventricle cavity is normal in size. Normal left ventricular wall thickness. Normal global wall motion. Doppler evidence of grade I  (impaired) diastolic dysfunction, normal LAP. Calculated EF 63%.  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  Trace aortic regurgitation.  Mild mitral valve prolapse with symmetric leaflets. Moderate (Grade II) mitral regurgitation.  Mild to moderate tricuspid regurgitation. Estimated pulmonary artery systolic pressure  30 mmHg.  No significant change compared to previous study on 07/18/2017. Marland Kitchen  Exercise myoview stress 04/14/2017:  1. The patient performed treadmill exercise using a Modified Bruce protocol, completing 9:59 minutes. The patient completed an estimated workload of 7.49 METS.  Normal HR and blood response. Exercise capacity fair for age.  2. No ischemic changes on stress EKG. 3. Gated SPECT images reveal normal myocardial thickening and wall motion.  The left ventricular ejection fraction was calculated or visually estimated to be 72%.   REST and STRESS images demonstrate decreased tracer uptake in the basal inferoseptal and mid inferoseptal segments of the left ventricle.  These defects are related to breast attenuation, imaging performed in sitting position.  4. This is a low risk study.  EKG  EKG 09/23/2021: Normal sinus rhythm with rate of 77 bpm, normal axis, low-voltage complexes.  No significant change from 09/23/2019  Assessment     ICD-10-CM   1. Primary hypertension  I10 amLODipine (NORVASC) 10 MG tablet    DISCONTINUED: amLODipine (NORVASC) 5 MG tablet    2. Pure hypercholesterolemia  E78.00 ezetimibe (ZETIA) 10 MG tablet    3. MVP (mitral valve prolapse)  I34.1 EKG 12-Lead    4. Stage 3a chronic kidney disease (HCC)  N18.31       Meds ordered this encounter  Medications   ezetimibe (ZETIA) 10 MG tablet    Sig: Take 1 tablet (10 mg total) by mouth daily. Restart Medication    Dispense:  90 tablet    Refill:  3   DISCONTD: amLODipine (NORVASC) 5 MG tablet    Sig: Take 1 tablet (5 mg total) by mouth daily.    Dispense:  90 tablet    Refill:  0    Refills to [facility]adar[facility]   amLODipine (NORVASC) 10 MG tablet    Sig: Take 1 tablet (10 mg total) by mouth daily.    Dispense:  90 tablet    Refill:  0    Refills to [facility] Varadarajan    Medications Discontinued During This Encounter  Medication Reason   COVID-19 mRNA bivalent vaccine, Pfizer, (PFIZER COVID-19 VAC  BIVALENT) injection Completed Course   ezetimibe (ZETIA) 10 MG tablet Patient Preference   COVID-19 mRNA Vac-TriS, Pfizer, SUSP injection Completed Course   metoprolol tartrate (LOPRESSOR) 25 MG tablet Patient Preference   rosuvastatin (CRESTOR) 5 MG tablet Change in therapy   amLODipine (NORVASC) 2.5 MG tablet Reorder   amLODipine (NORVASC) 5 MG tablet     Recommendations:   Kelly Reyes  is a 76 y.o.  female  with chronic stable palpitation,"skipping, fluttering", mitral valve prolapse with moderate mitral regurgitation, essential hypertension, chronic stage 3a CKD, mild hyperlipidemia presents here for 2 year follow up. She remains asymptomatic with regard  to mitral valve prolapse and moderate mitral regurgitation without clinical evidence of heart failure.  Blood pressure is elevated today, would recommend increasing the dose of amlodipine from 5 mg to 10 mg daily.  I suspect she probably will need 10 mg daily, goal diastolic blood pressure between 70 to 80 mmHg in view of stage IIIa chronic kidney disease.  With regard to hyperlipidemia, I reviewed external labs, LDL is not at goal.  Recently her Crestor was increased from 5 mg to 10 mg.  I do not think this will reduce her LDL to <100, I have advised her to restart her Zetia that she is on which she tolerated previously.  She has lab work scheduled for next month with her PCP.  I do not think she needs repeat echocardiogram, physical examination is unchanged from previous.  I will see her back in 2 years for follow-up of mitral regurgitation unless she develops any dyspnea, leg edema, worsening palpitations or chest pain then she will see me back sooner.  I will request Dr. Fara Olden to follow-up on her blood pressure on her next office visit as her diastolic blood pressure was very high.  I have reviewed her external labs.  Addendum: Patient called [place] back and updated her medication list, she is presently taking 5 mg amlodipine.   Advised her to increase it to 10 mg daily.   Adrian Prows, MD, [facility] 09/23/2021, 1:40 PM Office: [phone] Fax: [phone] Pager: [phone]

## 2021-09-28 DIAGNOSIS — M160 Bilateral primary osteoarthritis of hip: Secondary | ICD-10-CM | POA: Diagnosis not present

## 2021-09-28 DIAGNOSIS — M25551 Pain in right hip: Secondary | ICD-10-CM | POA: Diagnosis not present

## 2021-09-28 DIAGNOSIS — M25552 Pain in left hip: Secondary | ICD-10-CM | POA: Diagnosis not present

## 2021-10-04 DIAGNOSIS — M25651 Stiffness of right hip, not elsewhere classified: Secondary | ICD-10-CM | POA: Diagnosis not present

## 2021-10-04 DIAGNOSIS — M7061 Trochanteric bursitis, right hip: Secondary | ICD-10-CM | POA: Diagnosis not present

## 2021-10-04 DIAGNOSIS — M25652 Stiffness of left hip, not elsewhere classified: Secondary | ICD-10-CM | POA: Diagnosis not present

## 2021-10-04 DIAGNOSIS — M6281 Muscle weakness (generalized): Secondary | ICD-10-CM | POA: Diagnosis not present

## 2021-10-12 DIAGNOSIS — M6281 Muscle weakness (generalized): Secondary | ICD-10-CM | POA: Diagnosis not present

## 2021-10-12 DIAGNOSIS — M7061 Trochanteric bursitis, right hip: Secondary | ICD-10-CM | POA: Diagnosis not present

## 2021-10-12 DIAGNOSIS — M25652 Stiffness of left hip, not elsewhere classified: Secondary | ICD-10-CM | POA: Diagnosis not present

## 2021-10-12 DIAGNOSIS — M25651 Stiffness of right hip, not elsewhere classified: Secondary | ICD-10-CM | POA: Diagnosis not present

## 2021-10-18 DIAGNOSIS — M25652 Stiffness of left hip, not elsewhere classified: Secondary | ICD-10-CM | POA: Diagnosis not present

## 2021-10-18 DIAGNOSIS — M25651 Stiffness of right hip, not elsewhere classified: Secondary | ICD-10-CM | POA: Diagnosis not present

## 2021-10-18 DIAGNOSIS — M6281 Muscle weakness (generalized): Secondary | ICD-10-CM | POA: Diagnosis not present

## 2021-10-18 DIAGNOSIS — M7061 Trochanteric bursitis, right hip: Secondary | ICD-10-CM | POA: Diagnosis not present

## 2021-10-25 DIAGNOSIS — M7061 Trochanteric bursitis, right hip: Secondary | ICD-10-CM | POA: Diagnosis not present

## 2021-10-25 DIAGNOSIS — M25651 Stiffness of right hip, not elsewhere classified: Secondary | ICD-10-CM | POA: Diagnosis not present

## 2021-10-25 DIAGNOSIS — M25652 Stiffness of left hip, not elsewhere classified: Secondary | ICD-10-CM | POA: Diagnosis not present

## 2021-10-25 DIAGNOSIS — M6281 Muscle weakness (generalized): Secondary | ICD-10-CM | POA: Diagnosis not present

## 2021-11-01 DIAGNOSIS — M6281 Muscle weakness (generalized): Secondary | ICD-10-CM | POA: Diagnosis not present

## 2021-11-01 DIAGNOSIS — M25652 Stiffness of left hip, not elsewhere classified: Secondary | ICD-10-CM | POA: Diagnosis not present

## 2021-11-01 DIAGNOSIS — M7061 Trochanteric bursitis, right hip: Secondary | ICD-10-CM | POA: Diagnosis not present

## 2021-11-01 DIAGNOSIS — M25651 Stiffness of right hip, not elsewhere classified: Secondary | ICD-10-CM | POA: Diagnosis not present

## 2021-11-05 DIAGNOSIS — M1610 Unilateral primary osteoarthritis, unspecified hip: Secondary | ICD-10-CM | POA: Diagnosis not present

## 2021-11-05 DIAGNOSIS — E7800 Pure hypercholesterolemia, unspecified: Secondary | ICD-10-CM | POA: Diagnosis not present

## 2021-11-05 DIAGNOSIS — N1831 Chronic kidney disease, stage 3a: Secondary | ICD-10-CM | POA: Diagnosis not present

## 2021-11-05 DIAGNOSIS — I10 Essential (primary) hypertension: Secondary | ICD-10-CM | POA: Diagnosis not present

## 2021-11-09 DIAGNOSIS — M7061 Trochanteric bursitis, right hip: Secondary | ICD-10-CM | POA: Diagnosis not present

## 2021-11-09 DIAGNOSIS — M6281 Muscle weakness (generalized): Secondary | ICD-10-CM | POA: Diagnosis not present

## 2021-11-09 DIAGNOSIS — M25651 Stiffness of right hip, not elsewhere classified: Secondary | ICD-10-CM | POA: Diagnosis not present

## 2021-11-09 DIAGNOSIS — M25652 Stiffness of left hip, not elsewhere classified: Secondary | ICD-10-CM | POA: Diagnosis not present

## 2021-11-15 DIAGNOSIS — M7061 Trochanteric bursitis, right hip: Secondary | ICD-10-CM | POA: Diagnosis not present

## 2021-11-15 DIAGNOSIS — M6281 Muscle weakness (generalized): Secondary | ICD-10-CM | POA: Diagnosis not present

## 2021-11-15 DIAGNOSIS — M25652 Stiffness of left hip, not elsewhere classified: Secondary | ICD-10-CM | POA: Diagnosis not present

## 2021-11-15 DIAGNOSIS — M25651 Stiffness of right hip, not elsewhere classified: Secondary | ICD-10-CM | POA: Diagnosis not present

## 2021-11-19 DIAGNOSIS — H2513 Age-related nuclear cataract, bilateral: Secondary | ICD-10-CM | POA: Diagnosis not present

## 2021-11-19 DIAGNOSIS — H40013 Open angle with borderline findings, low risk, bilateral: Secondary | ICD-10-CM | POA: Diagnosis not present

## 2021-11-19 DIAGNOSIS — H5213 Myopia, bilateral: Secondary | ICD-10-CM | POA: Diagnosis not present

## 2021-11-22 DIAGNOSIS — M25651 Stiffness of right hip, not elsewhere classified: Secondary | ICD-10-CM | POA: Diagnosis not present

## 2021-11-22 DIAGNOSIS — M6281 Muscle weakness (generalized): Secondary | ICD-10-CM | POA: Diagnosis not present

## 2021-11-22 DIAGNOSIS — M7061 Trochanteric bursitis, right hip: Secondary | ICD-10-CM | POA: Diagnosis not present

## 2021-11-22 DIAGNOSIS — M25652 Stiffness of left hip, not elsewhere classified: Secondary | ICD-10-CM | POA: Diagnosis not present

## 2021-11-29 DIAGNOSIS — M25651 Stiffness of right hip, not elsewhere classified: Secondary | ICD-10-CM | POA: Diagnosis not present

## 2021-11-29 DIAGNOSIS — M6281 Muscle weakness (generalized): Secondary | ICD-10-CM | POA: Diagnosis not present

## 2021-11-29 DIAGNOSIS — M7061 Trochanteric bursitis, right hip: Secondary | ICD-10-CM | POA: Diagnosis not present

## 2021-11-29 DIAGNOSIS — M25652 Stiffness of left hip, not elsewhere classified: Secondary | ICD-10-CM | POA: Diagnosis not present

## 2021-12-20 DIAGNOSIS — M25551 Pain in right hip: Secondary | ICD-10-CM | POA: Diagnosis not present

## 2021-12-20 DIAGNOSIS — M1611 Unilateral primary osteoarthritis, right hip: Secondary | ICD-10-CM | POA: Diagnosis not present

## 2022-01-05 DIAGNOSIS — Z23 Encounter for immunization: Secondary | ICD-10-CM | POA: Diagnosis not present

## 2022-01-21 DIAGNOSIS — D1801 Hemangioma of skin and subcutaneous tissue: Secondary | ICD-10-CM | POA: Diagnosis not present

## 2022-01-21 DIAGNOSIS — D224 Melanocytic nevi of scalp and neck: Secondary | ICD-10-CM | POA: Diagnosis not present

## 2022-01-21 DIAGNOSIS — D225 Melanocytic nevi of trunk: Secondary | ICD-10-CM | POA: Diagnosis not present

## 2022-01-21 DIAGNOSIS — D2272 Melanocytic nevi of left lower limb, including hip: Secondary | ICD-10-CM | POA: Diagnosis not present

## 2022-01-21 DIAGNOSIS — L218 Other seborrheic dermatitis: Secondary | ICD-10-CM | POA: Diagnosis not present

## 2022-02-03 DIAGNOSIS — Z1231 Encounter for screening mammogram for malignant neoplasm of breast: Secondary | ICD-10-CM | POA: Diagnosis not present

## 2022-02-08 DIAGNOSIS — R922 Inconclusive mammogram: Secondary | ICD-10-CM | POA: Diagnosis not present

## 2022-02-08 DIAGNOSIS — R928 Other abnormal and inconclusive findings on diagnostic imaging of breast: Secondary | ICD-10-CM | POA: Diagnosis not present

## 2022-02-11 DIAGNOSIS — R110 Nausea: Secondary | ICD-10-CM | POA: Diagnosis not present

## 2022-02-25 DIAGNOSIS — H8110 Benign paroxysmal vertigo, unspecified ear: Secondary | ICD-10-CM | POA: Diagnosis not present

## 2022-02-25 DIAGNOSIS — E7800 Pure hypercholesterolemia, unspecified: Secondary | ICD-10-CM | POA: Diagnosis not present

## 2022-02-25 DIAGNOSIS — N1831 Chronic kidney disease, stage 3a: Secondary | ICD-10-CM | POA: Diagnosis not present

## 2022-02-25 DIAGNOSIS — M169 Osteoarthritis of hip, unspecified: Secondary | ICD-10-CM | POA: Diagnosis not present

## 2022-02-25 DIAGNOSIS — I10 Essential (primary) hypertension: Secondary | ICD-10-CM | POA: Diagnosis not present

## 2022-03-14 DIAGNOSIS — I10 Essential (primary) hypertension: Secondary | ICD-10-CM | POA: Diagnosis not present

## 2022-03-14 DIAGNOSIS — J9801 Acute bronchospasm: Secondary | ICD-10-CM | POA: Diagnosis not present

## 2022-03-23 DIAGNOSIS — J209 Acute bronchitis, unspecified: Secondary | ICD-10-CM | POA: Diagnosis not present

## 2022-03-23 DIAGNOSIS — I10 Essential (primary) hypertension: Secondary | ICD-10-CM | POA: Diagnosis not present

## 2022-04-06 DIAGNOSIS — E7800 Pure hypercholesterolemia, unspecified: Secondary | ICD-10-CM | POA: Diagnosis not present

## 2022-04-06 DIAGNOSIS — Z01818 Encounter for other preprocedural examination: Secondary | ICD-10-CM | POA: Diagnosis not present

## 2022-04-06 DIAGNOSIS — I10 Essential (primary) hypertension: Secondary | ICD-10-CM | POA: Diagnosis not present

## 2022-04-19 ENCOUNTER — Encounter: Payer: Self-pay | Admitting: Cardiology

## 2022-04-19 ENCOUNTER — Ambulatory Visit: Payer: Medicare Other | Admitting: Cardiology

## 2022-04-19 VITALS — BP 130/80 | HR 92 | Ht 66.0 in | Wt 122.0 lb

## 2022-04-19 DIAGNOSIS — I10 Essential (primary) hypertension: Secondary | ICD-10-CM | POA: Diagnosis not present

## 2022-04-19 DIAGNOSIS — Z01810 Encounter for preprocedural cardiovascular examination: Secondary | ICD-10-CM | POA: Diagnosis not present

## 2022-04-19 DIAGNOSIS — R002 Palpitations: Secondary | ICD-10-CM | POA: Diagnosis not present

## 2022-04-19 DIAGNOSIS — I340 Nonrheumatic mitral (valve) insufficiency: Secondary | ICD-10-CM | POA: Diagnosis not present

## 2022-04-19 NOTE — Progress Notes (Signed)
Primary Physician/Referring:  Leeroy Cha, MD  Patient ID: Kelly Reyes, female    DOB: [birth year], 76 y.o.   MRN: [id number]  Chief Complaint  Patient presents with   Medical Clearance   HPI:    Kelly Reyes  is a 76 y.o.female  with chronic stable palpitation,"skipping, fluttering", mitral valve prolapse with moderate mitral regurgitation, essential hypertension, chronic stage 3a CKD, mild hyperlipidemia presents for upcoming right hip arthroplasty & preoperative cardiac work up. I had seen her 6 months ago. She remains asymptomatic except for occasional palpitations that are brief.  Past Medical History:  Diagnosis Date   Hyperlipidemia    Hypertension    History reviewed. No pertinent surgical history. Family History  Problem Relation Age of Onset   Stroke Mother    Syncope episode Mother    Heart attack Father    Diabetes Father    Diabetes Brother     Social History   Tobacco Use   Smoking status: Never   Smokeless tobacco: Never  Substance Use Topics   Alcohol use: Not Currently   Marital Status: Married  ROS  Review of Systems  Cardiovascular:  Positive for palpitations. Negative for chest pain, dyspnea on exertion, leg swelling and syncope.  Gastrointestinal:  Negative for melena.   Objective  Blood pressure 130/80, pulse 92, height _0  (1.676 m), weight 122 lb (55.3 kg), SpO2 100 %.     04/19/2022    9:35 AM 09/23/2021   10:13 AM 09/23/2021   10:12 AM  Vitals with BMI  Height _1   _2   Weight 122 lbs  124 lbs 10 oz  BMI 13.2  44.01  Systolic 027 253 664  Diastolic 80 89 83  Pulse 92 83 88     Physical Exam Constitutional:      Appearance: She is well-developed.  Neck:     Vascular: No carotid bruit or JVD.  Cardiovascular:     Rate and Rhythm: Normal rate and regular rhythm.     Pulses: Intact distal pulses.     Heart sounds: A midsystolic click. Murmur heard.     Mid to late systolic murmur is present with a grade  of 2/6 at the apex. Mid systolic click heard     No gallop.  Pulmonary:     Effort: Pulmonary effort is normal.     Breath sounds: Normal breath sounds.  Abdominal:     General: Bowel sounds are normal.     Palpations: Abdomen is soft.  Musculoskeletal:     Right lower leg: No edema.     Left lower leg: No edema.    Laboratory examination:   Labs 08/27/2021:  Total cholesterol 216, triglycerides 74, HDL 60, LDL 144.  BUN 23, creatinine 1.24, EGFR 46 mL, potassium 3.8.  LFTs normal.  Hb 13.9/HCT 40.6, platelets 246, normal indicis.  TSH 0.620 07/31/2017  Medications and allergies   Allergies  Allergen Reactions   Codeine Nausea Only    Current Outpatient Medications:    amLODipine (NORVASC) 10 MG tablet, Take 1 tablet (10 mg total) by mouth daily., Disp: 90 tablet, Rfl: 0   cholecalciferol (VITAMIN D3) 25 MCG (1000 UT) tablet, Take 1,000 Units by mouth daily., Disp: , Rfl:    Multiple Vitamin (MULTIVITAMIN) capsule, Take by mouth daily., Disp: , Rfl:    rosuvastatin (CRESTOR) 10 MG tablet, Take 1 tablet by mouth at bedtime., Disp: , Rfl:    telmisartan-hydrochlorothiazide (MICARDIS HCT) 40-12.5 MG tablet, TAKE  1 TABLET BY MOUTH ONCE DAILY, Disp: , Rfl:    ezetimibe (ZETIA) 10 MG tablet, Take 1 tablet (10 mg total) by mouth daily. Restart Medication (Patient not taking: Reported on 04/19/2022), Disp: 90 tablet, Rfl: 3  Radiology:   No results found.  Cardiac Studies:   Echocardiogram 09/06/2018:  Left ventricle cavity is normal in size. Normal left ventricular wall thickness. Normal global wall motion. Doppler evidence of grade I  (impaired) diastolic dysfunction, normal LAP. Calculated EF 63%.  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  Trace aortic regurgitation.  Mild mitral valve prolapse with symmetric leaflets. Moderate (Grade II) mitral regurgitation.  Mild to moderate tricuspid regurgitation. Estimated pulmonary artery systolic pressure 30 mmHg.  No  significant change compared to previous study on 07/18/2017. Marland Kitchen  Exercise myoview stress 04/14/2017:  1. The patient performed treadmill exercise using a Modified Bruce protocol, completing 9:59 minutes. The patient completed an estimated workload of 7.49 METS.  Normal HR and blood response. Exercise capacity fair for age.  2. No ischemic changes on stress EKG. 3. Gated SPECT images reveal normal myocardial thickening and wall motion.  The left ventricular ejection fraction was calculated or visually estimated to be 72%.   REST and STRESS images demonstrate decreased tracer uptake in the basal inferoseptal and mid inferoseptal segments of the left ventricle.  These defects are related to breast attenuation, imaging performed in sitting position.  4. This is a low risk study.  EKG  EKG 04/19/2022: Normal sinus rhythm at rate of 78 bpm, borderline left atrial enlargement, normal axis.  No evidence of ischemia otherwise normal EKG. Compared to 09/23/2021, no significant change.  Assessment     ICD-10-CM   1. Preoperative cardiovascular examination  Z01.810 EKG 12-Lead    2. Moderate mitral regurgitation  I34.0     3. Primary hypertension  I10     4. Palpitations  R00.2       No orders of the defined types were placed in this encounter.   There are no discontinued medications.   Recommendations:   Kelly Reyes  is a 75 y.o. female  with chronic stable palpitation,"skipping, fluttering", mitral valve prolapse with moderate mitral regurgitation, essential hypertension, chronic stage 3a CKD, mild hyperlipidemia presents for upcoming right hip arthroplasty & preoperative cardiac work up. I had seen her 6 months ago. She remains asymptomatic except for occasional palpitations that are brief.  1. Preoperative cardiovascular examination Patient is completely asymptomatic, she has no significant cardiovascular risk factors.  No change in MR murmur or MVP, she can be taken up for the surgery  with low risk.  Dr. Dorna Leitz is performing the surgery and have sent him the letter.  2. Moderate mitral regurgitation No change in physical exam, I will see her back in 2 years as previously scheduled.  I do not think she needs repeat echocardiogram, we can consider repeating echocardiogram prior to her next office visit.  3. Primary hypertension Blood pressure is under excellent control.  4. Palpitations Stable symptoms, no worsening symptoms.  EKG reveals normal sinus rhythm.  No change from prior EKG.    Adrian Prows, MD, [facility] 04/19/2022, 9:51 AM Office: [phone] Fax: [phone] Pager: [phone]

## 2022-05-02 HISTORY — PX: HIP ARTHROPLASTY: SHX981

## 2022-05-03 DIAGNOSIS — M25551 Pain in right hip: Secondary | ICD-10-CM | POA: Diagnosis not present

## 2022-05-09 DIAGNOSIS — R262 Difficulty in walking, not elsewhere classified: Secondary | ICD-10-CM | POA: Diagnosis not present

## 2022-05-09 DIAGNOSIS — M1611 Unilateral primary osteoarthritis, right hip: Secondary | ICD-10-CM | POA: Diagnosis not present

## 2022-05-09 DIAGNOSIS — M25651 Stiffness of right hip, not elsewhere classified: Secondary | ICD-10-CM | POA: Diagnosis not present

## 2022-05-11 DIAGNOSIS — Z96641 Presence of right artificial hip joint: Secondary | ICD-10-CM | POA: Diagnosis not present

## 2022-05-11 DIAGNOSIS — M1611 Unilateral primary osteoarthritis, right hip: Secondary | ICD-10-CM | POA: Diagnosis not present

## 2022-05-13 DIAGNOSIS — M25651 Stiffness of right hip, not elsewhere classified: Secondary | ICD-10-CM | POA: Diagnosis not present

## 2022-05-13 DIAGNOSIS — M6281 Muscle weakness (generalized): Secondary | ICD-10-CM | POA: Diagnosis not present

## 2022-05-13 DIAGNOSIS — Z96641 Presence of right artificial hip joint: Secondary | ICD-10-CM | POA: Diagnosis not present

## 2022-05-18 DIAGNOSIS — M6281 Muscle weakness (generalized): Secondary | ICD-10-CM | POA: Diagnosis not present

## 2022-05-18 DIAGNOSIS — Z96641 Presence of right artificial hip joint: Secondary | ICD-10-CM | POA: Diagnosis not present

## 2022-05-18 DIAGNOSIS — M25651 Stiffness of right hip, not elsewhere classified: Secondary | ICD-10-CM | POA: Diagnosis not present

## 2022-05-20 DIAGNOSIS — Z96641 Presence of right artificial hip joint: Secondary | ICD-10-CM | POA: Diagnosis not present

## 2022-05-20 DIAGNOSIS — M6281 Muscle weakness (generalized): Secondary | ICD-10-CM | POA: Diagnosis not present

## 2022-05-20 DIAGNOSIS — M25651 Stiffness of right hip, not elsewhere classified: Secondary | ICD-10-CM | POA: Diagnosis not present

## 2022-05-24 DIAGNOSIS — Z9889 Other specified postprocedural states: Secondary | ICD-10-CM | POA: Diagnosis not present

## 2022-05-24 DIAGNOSIS — M6281 Muscle weakness (generalized): Secondary | ICD-10-CM | POA: Diagnosis not present

## 2022-05-24 DIAGNOSIS — M1611 Unilateral primary osteoarthritis, right hip: Secondary | ICD-10-CM | POA: Diagnosis not present

## 2022-05-24 DIAGNOSIS — Z471 Aftercare following joint replacement surgery: Secondary | ICD-10-CM | POA: Diagnosis not present

## 2022-05-24 DIAGNOSIS — Z96641 Presence of right artificial hip joint: Secondary | ICD-10-CM | POA: Diagnosis not present

## 2022-05-24 DIAGNOSIS — M25651 Stiffness of right hip, not elsewhere classified: Secondary | ICD-10-CM | POA: Diagnosis not present

## 2022-05-26 DIAGNOSIS — N1832 Chronic kidney disease, stage 3b: Secondary | ICD-10-CM | POA: Diagnosis not present

## 2022-05-26 DIAGNOSIS — N390 Urinary tract infection, site not specified: Secondary | ICD-10-CM | POA: Diagnosis not present

## 2022-05-26 DIAGNOSIS — R899 Unspecified abnormal finding in specimens from other organs, systems and tissues: Secondary | ICD-10-CM | POA: Diagnosis not present

## 2022-05-26 DIAGNOSIS — R8281 Pyuria: Secondary | ICD-10-CM | POA: Diagnosis not present

## 2022-05-26 DIAGNOSIS — M1990 Unspecified osteoarthritis, unspecified site: Secondary | ICD-10-CM | POA: Diagnosis not present

## 2022-05-26 DIAGNOSIS — N179 Acute kidney failure, unspecified: Secondary | ICD-10-CM | POA: Diagnosis not present

## 2022-05-26 DIAGNOSIS — Z96641 Presence of right artificial hip joint: Secondary | ICD-10-CM | POA: Diagnosis not present

## 2022-05-26 DIAGNOSIS — M6281 Muscle weakness (generalized): Secondary | ICD-10-CM | POA: Diagnosis not present

## 2022-05-26 DIAGNOSIS — E785 Hyperlipidemia, unspecified: Secondary | ICD-10-CM | POA: Diagnosis not present

## 2022-05-26 DIAGNOSIS — M25651 Stiffness of right hip, not elsewhere classified: Secondary | ICD-10-CM | POA: Diagnosis not present

## 2022-05-26 DIAGNOSIS — I129 Hypertensive chronic kidney disease with stage 1 through stage 4 chronic kidney disease, or unspecified chronic kidney disease: Secondary | ICD-10-CM | POA: Diagnosis not present

## 2022-05-27 ENCOUNTER — Other Ambulatory Visit: Payer: Self-pay | Admitting: Internal Medicine

## 2022-05-27 DIAGNOSIS — R8281 Pyuria: Secondary | ICD-10-CM

## 2022-05-27 DIAGNOSIS — N179 Acute kidney failure, unspecified: Secondary | ICD-10-CM

## 2022-05-30 DIAGNOSIS — M25651 Stiffness of right hip, not elsewhere classified: Secondary | ICD-10-CM | POA: Diagnosis not present

## 2022-05-30 DIAGNOSIS — M6281 Muscle weakness (generalized): Secondary | ICD-10-CM | POA: Diagnosis not present

## 2022-05-30 DIAGNOSIS — Z96641 Presence of right artificial hip joint: Secondary | ICD-10-CM | POA: Diagnosis not present

## 2022-06-01 DIAGNOSIS — Z96641 Presence of right artificial hip joint: Secondary | ICD-10-CM | POA: Diagnosis not present

## 2022-06-01 DIAGNOSIS — M25651 Stiffness of right hip, not elsewhere classified: Secondary | ICD-10-CM | POA: Diagnosis not present

## 2022-06-01 DIAGNOSIS — M6281 Muscle weakness (generalized): Secondary | ICD-10-CM | POA: Diagnosis not present

## 2022-06-02 ENCOUNTER — Ambulatory Visit
Admission: RE | Admit: 2022-06-02 | Discharge: 2022-06-02 | Disposition: A | Discharge status: Home / Self Care | Payer: Medicare Other | Source: Ambulatory Visit | Attending: Internal Medicine | Admitting: Internal Medicine

## 2022-06-02 DIAGNOSIS — R8281 Pyuria: Secondary | ICD-10-CM

## 2022-06-02 DIAGNOSIS — N179 Acute kidney failure, unspecified: Secondary | ICD-10-CM

## 2022-06-06 DIAGNOSIS — M25651 Stiffness of right hip, not elsewhere classified: Secondary | ICD-10-CM | POA: Diagnosis not present

## 2022-06-06 DIAGNOSIS — M6281 Muscle weakness (generalized): Secondary | ICD-10-CM | POA: Diagnosis not present

## 2022-06-06 DIAGNOSIS — Z96641 Presence of right artificial hip joint: Secondary | ICD-10-CM | POA: Diagnosis not present

## 2022-06-08 DIAGNOSIS — Z96641 Presence of right artificial hip joint: Secondary | ICD-10-CM | POA: Diagnosis not present

## 2022-06-08 DIAGNOSIS — M25651 Stiffness of right hip, not elsewhere classified: Secondary | ICD-10-CM | POA: Diagnosis not present

## 2022-06-08 DIAGNOSIS — M6281 Muscle weakness (generalized): Secondary | ICD-10-CM | POA: Diagnosis not present

## 2022-06-10 DIAGNOSIS — N1330 Unspecified hydronephrosis: Secondary | ICD-10-CM | POA: Diagnosis not present

## 2022-06-10 DIAGNOSIS — N200 Calculus of kidney: Secondary | ICD-10-CM | POA: Diagnosis not present

## 2022-06-13 ENCOUNTER — Other Ambulatory Visit: Payer: Self-pay | Admitting: Urology

## 2022-06-13 DIAGNOSIS — M6281 Muscle weakness (generalized): Secondary | ICD-10-CM | POA: Diagnosis not present

## 2022-06-13 DIAGNOSIS — M25651 Stiffness of right hip, not elsewhere classified: Secondary | ICD-10-CM | POA: Diagnosis not present

## 2022-06-13 DIAGNOSIS — Z96641 Presence of right artificial hip joint: Secondary | ICD-10-CM | POA: Diagnosis not present

## 2022-06-15 DIAGNOSIS — M6281 Muscle weakness (generalized): Secondary | ICD-10-CM | POA: Diagnosis not present

## 2022-06-15 DIAGNOSIS — M25651 Stiffness of right hip, not elsewhere classified: Secondary | ICD-10-CM | POA: Diagnosis not present

## 2022-06-15 DIAGNOSIS — Z96641 Presence of right artificial hip joint: Secondary | ICD-10-CM | POA: Diagnosis not present

## 2022-06-22 DIAGNOSIS — M6281 Muscle weakness (generalized): Secondary | ICD-10-CM | POA: Diagnosis not present

## 2022-06-22 DIAGNOSIS — M25651 Stiffness of right hip, not elsewhere classified: Secondary | ICD-10-CM | POA: Diagnosis not present

## 2022-06-22 DIAGNOSIS — Z96641 Presence of right artificial hip joint: Secondary | ICD-10-CM | POA: Diagnosis not present

## 2022-06-24 DIAGNOSIS — M6281 Muscle weakness (generalized): Secondary | ICD-10-CM | POA: Diagnosis not present

## 2022-06-24 DIAGNOSIS — M25651 Stiffness of right hip, not elsewhere classified: Secondary | ICD-10-CM | POA: Diagnosis not present

## 2022-06-24 DIAGNOSIS — Z96641 Presence of right artificial hip joint: Secondary | ICD-10-CM | POA: Diagnosis not present

## 2022-06-28 DIAGNOSIS — M25651 Stiffness of right hip, not elsewhere classified: Secondary | ICD-10-CM | POA: Diagnosis not present

## 2022-06-28 DIAGNOSIS — Z96641 Presence of right artificial hip joint: Secondary | ICD-10-CM | POA: Diagnosis not present

## 2022-06-28 DIAGNOSIS — M6281 Muscle weakness (generalized): Secondary | ICD-10-CM | POA: Diagnosis not present

## 2022-06-29 DIAGNOSIS — N200 Calculus of kidney: Secondary | ICD-10-CM | POA: Diagnosis not present

## 2022-06-30 DIAGNOSIS — M25651 Stiffness of right hip, not elsewhere classified: Secondary | ICD-10-CM | POA: Diagnosis not present

## 2022-06-30 DIAGNOSIS — Z96641 Presence of right artificial hip joint: Secondary | ICD-10-CM | POA: Diagnosis not present

## 2022-06-30 DIAGNOSIS — M6281 Muscle weakness (generalized): Secondary | ICD-10-CM | POA: Diagnosis not present

## 2022-07-05 DIAGNOSIS — M6281 Muscle weakness (generalized): Secondary | ICD-10-CM | POA: Diagnosis not present

## 2022-07-05 DIAGNOSIS — Z96641 Presence of right artificial hip joint: Secondary | ICD-10-CM | POA: Diagnosis not present

## 2022-07-05 DIAGNOSIS — M25651 Stiffness of right hip, not elsewhere classified: Secondary | ICD-10-CM | POA: Diagnosis not present

## 2022-07-05 NOTE — Patient Instructions (Addendum)
SURGICAL WAITING ROOM VISITATION  Patients having surgery or a procedure may have no more than 2 support people in the waiting area - these visitors may rotate.    Children under the age of 83 must have an adult with them who is not the patient.  Due to an increase in RSV and influenza rates and associated hospitalizations, children ages 28 and under may not visit patients in [place].  If the patient needs to stay at the hospital during part of their recovery, the visitor guidelines for inpatient rooms apply. Pre-op nurse will coordinate an appropriate time for 1 support person to accompany patient in pre-op.  This support person may not rotate.    Please refer to the [facility] website for the visitor guidelines for Inpatients (after your surgery is over and you are in a regular room).    Your procedure is scheduled on: 07/12/22   Report to [facility] Main Entrance    Report to admitting at 7:45 AM   Call this number if you have problems the morning of surgery [phone]   Do not eat food or drink liquids :After Midnight.          If you have questions, please contact your surgeon's office.   FOLLOW BOWEL PREP AND ANY ADDITIONAL PRE OP INSTRUCTIONS YOU RECEIVED FROM YOUR SURGEON'S OFFICE!!!     Oral Hygiene is also important to reduce your risk of infection.                                    Remember - BRUSH YOUR TEETH THE MORNING OF SURGERY WITH YOUR REGULAR TOOTHPASTE  DENTURES WILL BE REMOVED PRIOR TO SURGERY PLEASE DO NOT APPLY "Poly grip" OR ADHESIVES!!!   Take these medicines the morning of surgery with A SIP OF WATER: Amlodipine                               You may not have any metal on your body including hair pins, jewelry, and body piercing             Do not wear make-up, lotions, powders, perfumes, or deodorant  Do not wear nail polish including gel and S&S, artificial/acrylic nails, or any other type of covering on natural nails  including finger and toenails. If you have artificial nails, gel coating, etc. that needs to be removed by a nail salon please have this removed prior to surgery or surgery may need to be canceled/ delayed if the surgeon/ anesthesia feels like they are unable to be safely monitored.   Do not shave  48 hours prior to surgery.    Do not bring valuables to the hospital. [place].   Contacts, glasses, dentures or bridgework may not be worn into surgery.  DO NOT [place]. PHARMACY WILL DISPENSE MEDICATIONS LISTED ON YOUR MEDICATION LIST TO YOU DURING YOUR ADMISSION [place]!    Patients discharged on the day of surgery will not be allowed to drive home.  Someone NEEDS to stay with you for the first 24 hours after anesthesia.   Special Instructions: Bring a copy of your healthcare power of attorney and living will documents the day of surgery if  you haven't scanned them before.              Please read over the following fact sheets you were given: IF [place] [phone] Schneiders    If you received a COVID test during your pre-op visit  it is requested that you wear a mask when out in public, stay away from anyone that may not be feeling well and notify your surgeon if you develop symptoms. If you test positive for Covid or have been in contact with anyone that has tested positive in the last 10 days please notify you surgeon.    [place] - Preparing for Surgery Before surgery, you can play an important role.  Because skin is not sterile, your skin needs to be as free of germs as possible.  You can reduce the number of germs on your skin by washing with CHG (chlorahexidine gluconate) soap before surgery.  CHG is an antiseptic cleaner which kills germs and bonds with the skin to continue killing germs even after washing. Please DO NOT use if you have an  allergy to CHG or antibacterial soaps.  If your skin becomes reddened/irritated stop using the CHG and inform your nurse when you arrive at Short Stay. Do not shave (including legs and underarms) for at least 48 hours prior to the first CHG shower.  You may shave your face/neck.  Please follow these instructions carefully:  1.  Shower with CHG Soap the night before surgery and the  morning of surgery.  2.  If you choose to wash your hair, wash your hair first as usual with your normal  shampoo.  3.  After you shampoo, rinse your hair and body thoroughly to remove the shampoo.                             4.  Use CHG as you would any other liquid soap.  You can apply chg directly to the skin and wash.  Gently with a scrungie or clean washcloth.  5.  Apply the CHG Soap to your body ONLY FROM THE NECK DOWN.   Do   not use on face/ open                           Wound or open sores. Avoid contact with eyes, ears mouth and   genitals (private parts).                       Wash face,  Genitals (private parts) with your normal soap.             6.  Wash thoroughly, paying special attention to the area where your    surgery  will be performed.  7.  Thoroughly rinse your body with warm water from the neck down.  8.  DO NOT shower/wash with your normal soap after using and rinsing off the CHG Soap.                9.  Pat yourself dry with a clean towel.            10.  Wear clean pajamas.            11.  Place clean sheets on your bed the night of your first shower and do not  sleep with pets. Day of Surgery :  Do not apply any lotions/deodorants the morning of surgery.  Please wear clean clothes to the hospital/surgery center.  FAILURE TO FOLLOW THESE INSTRUCTIONS MAY RESULT IN THE CANCELLATION OF YOUR SURGERY  PATIENT SIGNATURE_________________________________  NURSE SIGNATURE__________________________________  ________________________________________________________________________

## 2022-07-05 NOTE — Progress Notes (Signed)
COVID Vaccine Completed: yes  Date of COVID positive in last 90 days:  PCP - Leeroy Cha, MD Cardiologist - Adrian Prows, MD  Chest x-ray -  EKG - 04/19/22 Epic Stress Test -  ECHO - 2020 Cardiac Cath -  Pacemaker/ICD device last checked: Spinal Cord Stimulator:  Bowel Prep -   Sleep Study -  CPAP -   Fasting Blood Sugar -  Checks Blood Sugar _____ times a day  Last dose of GLP1 agonist-  N/A GLP1 instructions:  N/A   Last dose of SGLT-2 inhibitors-  N/A SGLT-2 instructions: N/A   Blood Thinner Instructions: Aspirin Instructions: Last Dose:  Activity level:  Can go up a flight of stairs and perform activities of daily living without stopping and without symptoms of chest pain or shortness of breath.  Able to exercise without symptoms  Unable to go up a flight of stairs without symptoms of     Anesthesia review: palpitations, MVP, HTN, CKD, cardiac clearance?  Patient denies shortness of breath, fever, cough and chest pain at PAT appointment  Patient verbalized understanding of instructions that were given to them at the PAT appointment. Patient was also instructed that they will need to review over the PAT instructions again at home before surgery.

## 2022-07-06 ENCOUNTER — Encounter (HOSPITAL_COMMUNITY)
Admission: RE | Admit: 2022-07-06 | Discharge: 2022-07-06 | Disposition: A | Discharge status: Home / Self Care | Payer: Medicare Other | Source: Ambulatory Visit | Attending: Urology | Admitting: Urology

## 2022-07-06 ENCOUNTER — Encounter (HOSPITAL_COMMUNITY): Payer: Self-pay

## 2022-07-06 VITALS — BP 134/85 | HR 68 | Temp 98.4°F | Resp 14 | Ht 66.0 in | Wt 123.0 lb

## 2022-07-06 DIAGNOSIS — N183 Chronic kidney disease, stage 3 unspecified: Secondary | ICD-10-CM | POA: Insufficient documentation

## 2022-07-06 DIAGNOSIS — I10 Essential (primary) hypertension: Secondary | ICD-10-CM

## 2022-07-06 DIAGNOSIS — I129 Hypertensive chronic kidney disease with stage 1 through stage 4 chronic kidney disease, or unspecified chronic kidney disease: Secondary | ICD-10-CM | POA: Insufficient documentation

## 2022-07-06 DIAGNOSIS — N200 Calculus of kidney: Secondary | ICD-10-CM | POA: Diagnosis not present

## 2022-07-06 DIAGNOSIS — Z01812 Encounter for preprocedural laboratory examination: Secondary | ICD-10-CM | POA: Diagnosis not present

## 2022-07-06 HISTORY — DX: Palpitations: R00.2

## 2022-07-06 HISTORY — DX: Personal history of urinary calculi: Z87.442

## 2022-07-06 HISTORY — DX: Unspecified osteoarthritis, unspecified site: M19.90

## 2022-07-06 HISTORY — DX: Chronic kidney disease, unspecified: N18.9

## 2022-07-06 LAB — CBC
HCT: 40.5 % (ref 36.0–46.0)
Hemoglobin: 12.8 g/dL (ref 12.0–15.0)
MCH: 28.1 pg (ref 26.0–34.0)
MCHC: 31.6 g/dL (ref 30.0–36.0)
MCV: 89 fL (ref 80.0–100.0)
Platelets: 231 10*3/uL (ref 150–400)
RBC: 4.55 MIL/uL (ref 3.87–5.11)
RDW: 14.1 % (ref 11.5–15.5)
WBC: 6.8 10*3/uL (ref 4.0–10.5)
nRBC: 0 % (ref 0.0–0.2)

## 2022-07-06 LAB — BASIC METABOLIC PANEL
Anion gap: 11 (ref 5–15)
BUN: 29 mg/dL — ABNORMAL HIGH (ref 8–23)
CO2: 27 mmol/L (ref 22–32)
Calcium: 9.4 mg/dL (ref 8.9–10.3)
Chloride: 103 mmol/L (ref 98–111)
Creatinine, Ser: 1.57 mg/dL — ABNORMAL HIGH (ref 0.44–1.00)
GFR, Estimated: 34 mL/min — ABNORMAL LOW (ref >60)
Glucose, Bld: 98 mg/dL (ref 70–99)
Potassium: 3.9 mmol/L (ref 3.5–5.1)
Sodium: 141 mmol/L (ref 135–145)

## 2022-07-07 DIAGNOSIS — M6281 Muscle weakness (generalized): Secondary | ICD-10-CM | POA: Diagnosis not present

## 2022-07-07 DIAGNOSIS — M25651 Stiffness of right hip, not elsewhere classified: Secondary | ICD-10-CM | POA: Diagnosis not present

## 2022-07-07 DIAGNOSIS — Z96641 Presence of right artificial hip joint: Secondary | ICD-10-CM | POA: Diagnosis not present

## 2022-07-07 NOTE — Progress Notes (Signed)
Anesthesia Chart Review   Case: [id number] Date/Time: 07/12/22 0945   Procedures:      CYSTOSCOPY WITH RETROGRADE PYELOGRAM, URETEROSCOPY AND STENT PLACEMENT (Right) - 74 MINS     HOLMIUM  LASER APPLICATION (Right)   Anesthesia type: General   Pre-op diagnosis: RIGHT RENAL CALCULUS   Location: [place] / [facility] ORS   Surgeons: Robley Fries, MD       DISCUSSION:77 y.o. never smoker with h/o HTN, mitral valve prolapse with moderate mitral regurgitation, CKD Stage III, right renal calculus scheduled for above procedure 07/12/2022 with Dr. Jacalyn Lefevre.   Pt last seen by cardiology 04/19/2022. Per OV note, "Patient is completely asymptomatic, she has no significant cardiovascular risk factors. No change in MR murmur or MVP, she can be taken up for the surgery with low risk. "  Anticipate pt can proceed with planned procedure barring acute status change.   VS: BP 134/85   Pulse 68   Temp 36.9 C (Oral)   Resp 14   Ht '5\' 6"'$  (1.676 m)   Wt 55.8 kg   SpO2 100%   BMI 19.85 kg/m   PROVIDERS: Leeroy Cha, MD is PCP   Adrian Prows, MD is Cardiologist LABS: Labs reviewed: Acceptable for surgery. (all labs ordered are listed, but only abnormal results are displayed)  Labs Reviewed  BASIC METABOLIC PANEL - Abnormal; Notable for the following components:      Result Value   BUN 29 (*)    Creatinine, Ser 1.57 (*)    GFR, Estimated 34 (*)    All other components within normal limits  CBC     IMAGES:   EKG:   CV: Echocardiogram 09/06/2018:  Left ventricle cavity is normal in size. Normal left ventricular wall  thickness. Normal global wall motion. Doppler evidence of grade I  (impaired) diastolic dysfunction, normal LAP. Calculated EF 63%.  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  Trace aortic regurgitation.  Mild mitral valve prolapse with symmetric leaflets. Moderate (Grade II)  mitral regurgitation.  Mild to moderate tricuspid regurgitation.  Estimated pulmonary artery  systolic pressure 30 mmHg.  No significant change compared to previous study on 07/18/2017.   Exercise myoview stress 04/14/2017:  1. The patient performed treadmill exercise using a Modified Bruce protocol, completing 9:59 minutes. The patient completed an estimated workload of 7.49 METS.  Normal HR and blood response. Exercise capacity fair for age.  2. No ischemic changes on stress EKG. 3. Gated SPECT images reveal normal myocardial thickening and wall motion.  The left ventricular ejection fraction was calculated or visually estimated to be 72%.   REST and STRESS images demonstrate decreased tracer uptake in the basal inferoseptal and mid inferoseptal segments of the left ventricle.  These defects are related to breast attenuation, imaging performed in sitting position.  4. This is a low risk study.   Past Medical History:  Diagnosis Date   Arthritis    Chronic kidney disease    History of kidney stones    Hyperlipidemia    Hypertension    Palpitations     Past Surgical History:  Procedure Laterality Date   COLONOSCOPY     x2   HIP ARTHROPLASTY Right 2024   TUBAL LIGATION     WISDOM TOOTH EXTRACTION      MEDICATIONS:  cephALEXin (KEFLEX) 500 MG capsule   amLODipine (NORVASC) 5 MG tablet   cholecalciferol (VITAMIN D3) 25 MCG (1000 UT) tablet   rosuvastatin (CRESTOR) 10 MG tablet  telmisartan-hydrochlorothiazide (MICARDIS HCT) 40-12.5 MG tablet   No current facility-administered medications for this encounter.     Konrad Felix Ward, PA-C [facility] Pre-Surgical Testing [phone]

## 2022-07-12 ENCOUNTER — Ambulatory Visit (HOSPITAL_COMMUNITY): Payer: Medicare Other | Admitting: Physician Assistant

## 2022-07-12 ENCOUNTER — Ambulatory Visit (HOSPITAL_BASED_OUTPATIENT_CLINIC_OR_DEPARTMENT_OTHER): Payer: Medicare Other | Admitting: Certified Registered Nurse Anesthetist

## 2022-07-12 ENCOUNTER — Encounter (HOSPITAL_COMMUNITY)
Admission: RE | Disposition: A | Discharge status: Home / Self Care | Payer: Self-pay | Source: Home / Self Care | Attending: Urology

## 2022-07-12 ENCOUNTER — Observation Stay (HOSPITAL_COMMUNITY)
Admission: RE | Admit: 2022-07-12 | Discharge: 2022-07-13 | Disposition: A | Discharge status: Home / Self Care | Payer: Medicare Other | Attending: Urology | Admitting: Urology

## 2022-07-12 ENCOUNTER — Other Ambulatory Visit: Payer: Self-pay

## 2022-07-12 ENCOUNTER — Ambulatory Visit (HOSPITAL_COMMUNITY): Payer: Medicare Other

## 2022-07-12 ENCOUNTER — Encounter (HOSPITAL_COMMUNITY): Payer: Self-pay | Admitting: Urology

## 2022-07-12 DIAGNOSIS — N3592 Unspecified urethral stricture, female: Secondary | ICD-10-CM | POA: Diagnosis not present

## 2022-07-12 DIAGNOSIS — Z79899 Other long term (current) drug therapy: Secondary | ICD-10-CM | POA: Insufficient documentation

## 2022-07-12 DIAGNOSIS — N189 Chronic kidney disease, unspecified: Secondary | ICD-10-CM | POA: Insufficient documentation

## 2022-07-12 DIAGNOSIS — N200 Calculus of kidney: Secondary | ICD-10-CM

## 2022-07-12 DIAGNOSIS — I129 Hypertensive chronic kidney disease with stage 1 through stage 4 chronic kidney disease, or unspecified chronic kidney disease: Secondary | ICD-10-CM | POA: Insufficient documentation

## 2022-07-12 DIAGNOSIS — Z96641 Presence of right artificial hip joint: Secondary | ICD-10-CM | POA: Insufficient documentation

## 2022-07-12 DIAGNOSIS — N132 Hydronephrosis with renal and ureteral calculous obstruction: Secondary | ICD-10-CM

## 2022-07-12 DIAGNOSIS — N1330 Unspecified hydronephrosis: Secondary | ICD-10-CM | POA: Diagnosis not present

## 2022-07-12 DIAGNOSIS — Z436 Encounter for attention to other artificial openings of urinary tract: Secondary | ICD-10-CM | POA: Diagnosis not present

## 2022-07-12 HISTORY — PX: CYSTOSCOPY WITH RETROGRADE PYELOGRAM, URETEROSCOPY AND STENT PLACEMENT: SHX5789

## 2022-07-12 LAB — BASIC METABOLIC PANEL
Anion gap: 12 (ref 5–15)
BUN: 27 mg/dL — ABNORMAL HIGH (ref 8–23)
CO2: 26 mmol/L (ref 22–32)
Calcium: 9.3 mg/dL (ref 8.9–10.3)
Chloride: 102 mmol/L (ref 98–111)
Creatinine, Ser: 1.6 mg/dL — ABNORMAL HIGH (ref 0.44–1.00)
GFR, Estimated: 34 mL/min — ABNORMAL LOW (ref >60)
Glucose, Bld: 98 mg/dL (ref 70–99)
Potassium: 3.2 mmol/L — ABNORMAL LOW (ref 3.5–5.1)
Sodium: 140 mmol/L (ref 135–145)

## 2022-07-12 LAB — PROTIME-INR
INR: 1.2 (ref 0.8–1.2)
Prothrombin Time: 14.8 seconds (ref 11.4–15.2)

## 2022-07-12 SURGERY — CYSTOURETEROSCOPY, WITH RETROGRADE PYELOGRAM AND STENT INSERTION
Anesthesia: General | Laterality: Right

## 2022-07-12 MED ORDER — PROPOFOL 10 MG/ML IV BOLUS
INTRAVENOUS | Status: AC
  Filled 2022-07-12: qty 20

## 2022-07-12 MED ORDER — LACTATED RINGERS IV SOLN: INTRAVENOUS | Status: DC

## 2022-07-12 MED ORDER — ORAL CARE MOUTH RINSE: 15.0000 mL | Freq: Once | OROMUCOSAL | Status: AC

## 2022-07-12 MED ORDER — DEXAMETHASONE SODIUM PHOSPHATE 10 MG/ML IJ SOLN
INTRAMUSCULAR | Status: DC | PRN
  Administered 2022-07-12: 5 mg via INTRAVENOUS

## 2022-07-12 MED ORDER — ONDANSETRON HCL 4 MG/2ML IJ SOLN
INTRAMUSCULAR | Status: DC | PRN
  Administered 2022-07-12: 4 mg via INTRAVENOUS

## 2022-07-12 MED ORDER — IOHEXOL 300 MG/ML  SOLN
INTRAMUSCULAR | Status: DC | PRN
  Administered 2022-07-12: 7 mL

## 2022-07-12 MED ORDER — FENTANYL CITRATE PF 50 MCG/ML IJ SOSY: 25.0000 ug | PREFILLED_SYRINGE | INTRAMUSCULAR | Status: DC | PRN

## 2022-07-12 MED ORDER — FENTANYL CITRATE (PF) 100 MCG/2ML IJ SOLN
INTRAMUSCULAR | Status: AC
  Filled 2022-07-12: qty 2

## 2022-07-12 MED ORDER — IRBESARTAN 150 MG PO TABS
150.0000 mg | ORAL_TABLET | Freq: Every day | ORAL | Status: DC
  Administered 2022-07-12 – 2022-07-13 (×2): 150 mg via ORAL
  Filled 2022-07-12 (×2): qty 1

## 2022-07-12 MED ORDER — ROSUVASTATIN CALCIUM 10 MG PO TABS
10.0000 mg | ORAL_TABLET | Freq: Every day | ORAL | Status: DC
  Administered 2022-07-12: 10 mg via ORAL
  Filled 2022-07-12: qty 1

## 2022-07-12 MED ORDER — MIDAZOLAM HCL 2 MG/2ML IJ SOLN
INTRAMUSCULAR | Status: AC
  Filled 2022-07-12: qty 2

## 2022-07-12 MED ORDER — HYDROCHLOROTHIAZIDE 12.5 MG PO TABS
12.5000 mg | ORAL_TABLET | Freq: Every day | ORAL | Status: DC
  Administered 2022-07-12 – 2022-07-13 (×2): 12.5 mg via ORAL
  Filled 2022-07-12 (×2): qty 1

## 2022-07-12 MED ORDER — SODIUM CHLORIDE 0.9 % IR SOLN
Status: DC | PRN
  Administered 2022-07-12: 3000 mL via INTRAVESICAL

## 2022-07-12 MED ORDER — SODIUM CHLORIDE 0.9 % IV SOLN
2.0000 g | INTRAVENOUS | Status: AC
  Administered 2022-07-13: 2 g via INTRAVENOUS

## 2022-07-12 MED ORDER — SODIUM CHLORIDE 0.9 % IV SOLN: 2.0000 g | INTRAVENOUS | Status: DC

## 2022-07-12 MED ORDER — EPHEDRINE SULFATE-NACL 50-0.9 MG/10ML-% IV SOSY
PREFILLED_SYRINGE | INTRAVENOUS | Status: DC | PRN
  Administered 2022-07-12: 10 mg via INTRAVENOUS

## 2022-07-12 MED ORDER — AMLODIPINE BESYLATE 5 MG PO TABS
5.0000 mg | ORAL_TABLET | Freq: Every morning | ORAL | Status: DC
  Administered 2022-07-13: 5 mg via ORAL
  Filled 2022-07-12: qty 1

## 2022-07-12 MED ORDER — LIDOCAINE 2% (20 MG/ML) 5 ML SYRINGE
INTRAMUSCULAR | Status: DC | PRN
  Administered 2022-07-12: 60 mg via INTRAVENOUS

## 2022-07-12 MED ORDER — ONDANSETRON HCL 4 MG/2ML IJ SOLN
INTRAMUSCULAR | Status: AC
  Filled 2022-07-12: qty 2

## 2022-07-12 MED ORDER — ONDANSETRON HCL 4 MG/2ML IJ SOLN: 4.0000 mg | Freq: Four times a day (QID) | INTRAMUSCULAR | Status: DC | PRN

## 2022-07-12 MED ORDER — DEXAMETHASONE SODIUM PHOSPHATE 10 MG/ML IJ SOLN
INTRAMUSCULAR | Status: AC
  Filled 2022-07-12: qty 1

## 2022-07-12 MED ORDER — CHLORHEXIDINE GLUCONATE 0.12 % MT SOLN
15.0000 mL | Freq: Once | OROMUCOSAL | Status: AC
  Administered 2022-07-12: 15 mL via OROMUCOSAL

## 2022-07-12 MED ORDER — TELMISARTAN-HCTZ 40-12.5 MG PO TABS: 1.0000 | ORAL_TABLET | Freq: Every day | ORAL | Status: DC

## 2022-07-12 MED ORDER — MIDAZOLAM HCL 2 MG/2ML IJ SOLN
INTRAMUSCULAR | Status: DC | PRN
  Administered 2022-07-12 (×2): 1 mg via INTRAVENOUS

## 2022-07-12 MED ORDER — PROPOFOL 500 MG/50ML IV EMUL
INTRAVENOUS | Status: DC | PRN
  Administered 2022-07-12: 100 mg via INTRAVENOUS

## 2022-07-12 MED ORDER — FENTANYL CITRATE (PF) 100 MCG/2ML IJ SOLN
INTRAMUSCULAR | Status: DC | PRN
  Administered 2022-07-12: 50 ug via INTRAVENOUS

## 2022-07-12 MED ORDER — CEFAZOLIN SODIUM-DEXTROSE 2-4 GM/100ML-% IV SOLN
2.0000 g | INTRAVENOUS | Status: AC
  Administered 2022-07-12: 2 g via INTRAVENOUS
  Filled 2022-07-12: qty 100

## 2022-07-12 SURGICAL SUPPLY — 24 items
BAG URO CATCHER STRL LF (MISCELLANEOUS) ×1 IMPLANT
BASKET ZERO TIP NITINOL 2.4FR (BASKET) IMPLANT
BSKT STON RTRVL ZERO TP 2.4FR (BASKET)
CATH URETL OPEN 5X70 (CATHETERS) ×1 IMPLANT
CLOTH BEACON ORANGE TIMEOUT ST (SAFETY) ×1 IMPLANT
DRSG TEGADERM 2-3/8X2-3/4 SM (GAUZE/BANDAGES/DRESSINGS) IMPLANT
EXTRACTOR STONE 1.7FRX115CM (UROLOGICAL SUPPLIES) IMPLANT
FIBER LASER MOSES 200 DFL (Laser) IMPLANT
FIBER LASER MOSES 365 DFL (Laser) IMPLANT
GLOVE BIO SURGEON STRL SZ 6.5 (GLOVE) ×1 IMPLANT
GOWN STRL REUS W/ TWL LRG LVL3 (GOWN DISPOSABLE) ×1 IMPLANT
GOWN STRL REUS W/TWL LRG LVL3 (GOWN DISPOSABLE) ×1
GUIDEWIRE ANG ZIPWIRE 038X150 (WIRE) IMPLANT
GUIDEWIRE STR DUAL SENSOR (WIRE) ×1 IMPLANT
KIT TURNOVER KIT A (KITS) IMPLANT
LASER FIB FLEXIVA PULSE ID 365 (Laser) IMPLANT
MANIFOLD NEPTUNE II (INSTRUMENTS) ×1 IMPLANT
PACK CYSTO (CUSTOM PROCEDURE TRAY) ×1 IMPLANT
SHEATH NAVIGATOR HD 11/13X28 (SHEATH) IMPLANT
SHEATH NAVIGATOR HD 11/13X36 (SHEATH) IMPLANT
TRACTIP FLEXIVA PULS ID 200XHI (Laser) IMPLANT
TRACTIP FLEXIVA PULSE ID 200 (Laser)
TUBING CONNECTING 10 (TUBING) ×1 IMPLANT
TUBING UROLOGY SET (TUBING) ×1 IMPLANT

## 2022-07-12 NOTE — H&P (Signed)
CC/HPI: cc: Right hydronephrosis   06/10/22: 77 year old woman who is referred to nephrology for an AKI with an increased creatinine to 1.6 from her baseline subsequently underwent a renal ultrasound showing right hydronephrosis. There is concern for large calculus in the right renal pelvis. Patient denies any flank pain, UTIs, gross hematuria or history of kidney stones. She did have her hip replaced approximately 1 month ago.   06/29/22: Kelly Reyes is a 77 year old female who presents today for preoperative appointment. She is scheduled to undergo surgery with Dr. Claudia Desanctis for a right-sided renal stone. She currently has no complaints. She denies fevers, chills and gross hematuria. She denies chest pain and shortness of breath.     ALLERGIES: Codeine - Nausea Donepezil HCl TABS - Nausea    MEDICATIONS: Amlodipine Besylate 5 mg tablet  Ezetimibe 10 mg tablet  Micardis Hct 40 mg-12.5 mg tablet  Rosuvastatin Calcium 10 mg tablet  Telmisartan-Hydrochlorothiazid 40 mg-12.5 mg tablet     GU PSH: None     PSH Notes: Colon polyp removal, D&C of uterus, right breast biopsy, hip surgery   NON-GU PSH: Bilateral Tubal Ligation Hip Replacement, 05/11/2022         GU PMH: Renal calculus - 06/10/2022 Ureteral obstruction secondary to calculous - 06/10/2022      PMH Notes: Heart murmur, AKI   NON-GU PMH: Arthritis Hypercholesterolemia Hypertension Vitamin D deficiency, unspecified    FAMILY HISTORY: 2 daughters - Daughter cerebrovascular accident (CVA) - Mother Diabetes - Father, Brother Heart Attack - Father Heart Disease - Father nephrolithiasis - Daughter stroke - Mother   SOCIAL HISTORY: Marital Status: Married Preferred Language: English; Ethnicity: Not Hispanic Or Latino; Race: Black or African American Current Smoking Status: Patient has never smoked.  <DIV'  Tobacco Use Assessment Completed:  Used Tobacco in last 30 days?   Has never drank.  Does not drink caffeine.    REVIEW OF  SYSTEMS:     GU Review Female:  Patient denies frequent urination, hard to postpone urination, burning /pain with urination, get up at night to urinate, leakage of urine, stream starts and stops, trouble starting your stream, have to strain to urinate, and being pregnant.    Gastrointestinal (Upper):  Patient denies nausea, vomiting, and indigestion/ heartburn.    Gastrointestinal (Lower):  Patient denies diarrhea and constipation.    Constitutional:  Patient denies fever, night sweats, weight loss, and fatigue.    Skin:  Patient denies skin rash/ lesion and itching.    Eyes:  Patient denies blurred vision and double vision.    Hematologic/Lymphatic:  Patient denies easy bruising and swollen glands.    Cardiovascular:  Patient denies leg swelling and chest pains.    Respiratory:  Patient denies cough and shortness of breath.    Musculoskeletal:  Patient denies back pain and joint pain.    Neurological:  Patient denies headaches and dizziness.    Psychologic:  Patient denies depression and anxiety.    VITAL SIGNS:       06/29/2022 10:27 AM     BP 145/90 mmHg     Heart Rate 83 /min     Temperature 97.7 F / 36.5 C     MULTI-SYSTEM PHYSICAL EXAMINATION:      Constitutional: Well-nourished. No physical deformities. Normally developed. Good grooming.     Respiratory: Normal breath sounds. No labored breathing, no use of accessory muscles.      Cardiovascular: Regular rate and rhythm. No murmur, no gallop. Normal temperature, normal extremity pulses, no swelling,  no varicosities.      Lymphatic: No enlargement of neck, axillae, groin.     Skin: No paleness, no jaundice, no cyanosis. No lesion, no ulcer, no rash.     Neurologic / Psychiatric: Oriented to time, oriented to place, oriented to person. No depression, no anxiety, no agitation.     Gastrointestinal: No mass, no tenderness, no rigidity, non obese abdomen.     Musculoskeletal: Normal gait and station of head and neck.             Complexity of Data:   Source Of History:  Patient  Records Review:  Previous Doctor Records, Previous Patient Records  Urine Test Review:  Urinalysis    06/29/22  Urinalysis  Urine Appearance Slightly Cloudy   Urine Color Yellow   Urine Glucose Neg mg/dL  Urine Bilirubin Neg mg/dL  Urine Ketones Neg mg/dL  Urine Specific Gravity 1.025   Urine Blood Neg ery/uL  Urine pH 6.0   Urine Protein Neg mg/dL  Urine Urobilinogen 0.2 mg/dL  Urine Nitrites Neg   Urine Leukocyte Esterase 3+ leu/uL  Urine WBC/hpf 20 - 40/hpf   Urine RBC/hpf 0 - 2/hpf   Urine Epithelial Cells 0 - 5/hpf   Urine Bacteria Mod (26-50/hpf)   Urine Mucous Not Present   Urine Yeast NS (Not Seen)   Urine Trichomonas Not Present   Urine Cystals NS (Not Seen)   Urine Casts NS (Not Seen)   Urine Sperm Not Present    PROCEDURES:    Urinalysis w/Scope  Dipstick Dipstick Cont'd Micro  Color: Yellow Bilirubin: Neg mg/dL WBC/hpf: 20 - 40/hpf  Appearance: Slightly Cloudy Ketones: Neg mg/dL RBC/hpf: 0 - 2/hpf  Specific Gravity: 1.025 Blood: Neg ery/uL Bacteria: Mod (26-50/hpf)  pH: 6.0 Protein: Neg mg/dL Cystals: NS (Not Seen)  Glucose: Neg mg/dL Urobilinogen: 0.2 mg/dL Casts: NS (Not Seen)   Nitrites: Neg Trichomonas: Not Present   Leukocyte Esterase: 3+ leu/uL Mucous: Not Present    Epithelial Cells: 0 - 5/hpf    Yeast: NS (Not Seen)    Sperm: Not Present    ASSESSMENT:     ICD-10 Details  1 GU:  Renal calculus - N20.0 Right, Chronic, Stable   PLAN:   Document  Letter(s):  Created for Patient: Clinical Summary   Notes:  All of her questions were answered to the best of my ability. She will keep surgery as scheduled for 07/12/22. Notify the clinic of any changes.

## 2022-07-12 NOTE — Anesthesia Procedure Notes (Signed)
Procedure Name: LMA Insertion Date/Time: 07/12/2022 9:09 AM  Performed by: Gerald Leitz, CRNAPre-anesthesia Checklist: Patient identified, Patient being monitored, Timeout performed, Emergency Drugs available and Suction available Patient Re-evaluated:Patient Re-evaluated prior to induction Oxygen Delivery Method: Circle system utilized Preoxygenation: Pre-oxygenation with 100% oxygen Induction Type: IV induction Ventilation: Mask ventilation without difficulty LMA: LMA inserted LMA Size: 4.0 Tube type: Oral Number of attempts: 1 Placement Confirmation: positive ETCO2 and breath sounds checked- equal and bilateral Tube secured with: Tape Dental Injury: Teeth and Oropharynx as per pre-operative assessment

## 2022-07-12 NOTE — Consult Note (Signed)
Chief Complaint: Patient was seen in consultation today for right renal stone with hydronephrosis; right PCN  Referring Physician(s): Dr. Claudia Desanctis  Supervising Physician: Aletta Edouard  Patient Status: [facility] - In-pt  History of Present Illness: Kelly Reyes is a 77 y.o. female with a medical history significant for CKD and HTN. She was recently referred to Nephrology for an AKI and a renal ultrasound showed right hydronephrosis secondary to renal calculi. She was then referred to Urology and she was taken to the OR today for attempted stent placement. A wire was unable to be passed beyond the stone.    Interventional Radiology has been asked to evaluate this patient for an image-guided right percutaneous nephrostomy tube. Imaging reviewed and procedure approved by Dr. Kathlene Cote.   Past Medical History:  Diagnosis Date   Arthritis    Chronic kidney disease    History of kidney stones    Hyperlipidemia    Hypertension    Palpitations     Past Surgical History:  Procedure Laterality Date   COLONOSCOPY     x2   HIP ARTHROPLASTY Right 2024   TUBAL LIGATION     WISDOM TOOTH EXTRACTION      Allergies: Codeine and Oxycodone  Medications: Prior to Admission medications   Medication Sig Start Date End Date Taking? Authorizing Provider  amLODipine (NORVASC) 5 MG tablet Take 5 mg by mouth in the morning.   Yes [provider]  cefdinir (OMNICEF) 300 MG capsule Take 300 mg by mouth 2 (two) times daily. Patient unsure if this is the antibiotic she took.   Yes [provider]  rosuvastatin (CRESTOR) 10 MG tablet Take 1 tablet by mouth at bedtime. 09/01/21  Yes [provider]  telmisartan-hydrochlorothiazide (MICARDIS HCT) 40-12.5 MG tablet TAKE 1 TABLET BY MOUTH ONCE DAILY 12/25/15  Yes [provider]  cholecalciferol (VITAMIN D3) 25 MCG (1000 UT) tablet Take 1,000 Units by mouth daily.    [provider]     Family History  Problem  Relation Age of Onset   Stroke Mother    Syncope episode Mother    Heart attack Father    Diabetes Father    Diabetes Brother     Social History   Socioeconomic History   Marital status: Married    Spouse name: Not on file   Number of children: 2   Years of education: Not on file   Highest education level: Not on file  Occupational History   Not on file  Tobacco Use   Smoking status: Never   Smokeless tobacco: Never  Vaping Use   Vaping Use: Not on file  Substance and Sexual Activity   Alcohol use: Not Currently   Drug use: Never   Sexual activity: Not on file  Other Topics Concern   Not on file  Social History Narrative   Not on file   Social Determinants of Health   Financial Resource Strain: Not on file  Food Insecurity: Not on file  Transportation Needs: Not on file  Physical Activity: Not on file  Stress: Not on file  Social Connections: Not on file    Review of Systems: A 12 point ROS discussed and pertinent positives are indicated in the HPI above.  All other systems are negative.  Review of Systems  Constitutional:  Negative for appetite change and fatigue.  Respiratory:  Negative for cough and shortness of breath.   Cardiovascular:  Negative for chest pain and leg swelling.  Gastrointestinal:  Negative for abdominal pain, diarrhea and nausea.  Genitourinary:  Positive for hematuria.  Neurological:  Negative for dizziness and headaches.    Vital Signs: BP 113/70   Pulse 77   Temp 97.8 F (36.6 C)   Resp 16   Ht '5\' 6"'$  (1.676 m)   Wt 123 lb (55.8 kg)   SpO2 100%   BMI 19.85 kg/m   Physical Exam Constitutional:      General: She is not in acute distress.    Appearance: She is not ill-appearing.  HENT:     Mouth/Throat:     Mouth: Mucous membranes are moist.     Pharynx: Oropharynx is clear.  Cardiovascular:     Rate and Rhythm: Normal rate and regular rhythm.     Pulses: Normal pulses.     Heart sounds: Normal heart sounds.  Pulmonary:      Effort: Pulmonary effort is normal.     Breath sounds: Normal breath sounds.  Abdominal:     General: Bowel sounds are normal.     Palpations: Abdomen is soft.     Tenderness: There is no abdominal tenderness.  Musculoskeletal:     Right lower leg: No edema.     Left lower leg: No edema.  Skin:    General: Skin is warm and dry.  Neurological:     Mental Status: She is alert and oriented to person, place, and time.  Psychiatric:        Mood and Affect: Mood normal.        Behavior: Behavior normal.        Thought Content: Thought content normal.        Judgment: Judgment normal.     Imaging: DG C-Arm 1-60 Min-No Report  Result Date: 07/12/2022 Fluoroscopy was utilized by the requesting physician.  No radiographic interpretation.    Labs:  CBC: Recent Labs    07/06/22 1005  WBC 6.8  HGB 12.8  HCT 40.5  PLT 231    COAGS: Recent Labs    07/12/22 1013  INR 1.2    BMP: Recent Labs    07/06/22 1005 07/12/22 0833  NA 141 140  K 3.9 3.2*  CL 103 102  CO2 27 26  GLUCOSE 98 98  BUN 29* 27*  CALCIUM 9.4 9.3  CREATININE 1.57* 1.60*  GFRNONAA 34* 34*    LIVER FUNCTION TESTS: No results for input(s): "BILITOT", "AST", "ALT", "ALKPHOS", "PROT", "ALBUMIN" in the last 8760 hours.  TUMOR MARKERS: No results for input(s): "AFPTM", "CEA", "CA199", "CHROMGRNA" in the last 8760 hours.  Assessment and Plan:  Right renal calculi with right hydronephrosis: Kelly Reyes, 77 year old female, is tentatively scheduled 07/13/22 for an image-guided right percutaneous nephrostomy tube. IR will attempt an antegrade ureteral stent placement as an outpatient in possibly one or two weeks.  Risks and benefits of Right PCN placement was discussed with the patient including, but not limited to, infection, bleeding, significant bleeding causing loss or decrease in renal function or damage to adjacent structures.   All of the patient's questions were answered, patient is  agreeable to proceed. She will be NPO at midnight. CBC and PT/INR ordered for the morning.   Consent signed and in chart.  Thank you for this interesting consult.  I greatly enjoyed meeting Kelly Reyes and look forward to participating in their care.  A copy of this report was sent to the requesting provider on this date.  Electronically Signed: Soyla Dryer, AGACNP-BC [phone] 07/12/2022, 3:10  PM   I spent a total of 20 Minutes    in face to face in clinical consultation, greater than 50% of which was counseling/coordinating care for right PCN.

## 2022-07-12 NOTE — Anesthesia Postprocedure Evaluation (Signed)
Anesthesia Post Note  Patient: Kelly Reyes  Procedure(s) Performed: CYSTOSCOPY WITH RETROGRADE PYELOGRAM, URETHRAL DILATION (Right)     Patient location during evaluation: PACU Anesthesia Type: General Level of consciousness: awake and alert Pain management: pain level controlled Vital Signs Assessment: post-procedure vital signs reviewed and stable Respiratory status: spontaneous breathing, nonlabored ventilation, respiratory function stable and patient connected to nasal cannula oxygen Cardiovascular status: blood pressure returned to baseline and stable Postop Assessment: no apparent nausea or vomiting Anesthetic complications: no   No notable events documented.  Last Vitals:  Vitals:   07/12/22 1015 07/12/22 1100  BP: 115/71 110/65  Pulse: 67 71  Resp: 15 12  Temp: (!) 36.4 C   SpO2: 98% 100%    Last Pain:  Vitals:   07/12/22 1100  TempSrc:   PainSc: 0-No pain                 Shylo Zamor S

## 2022-07-12 NOTE — Interval H&P Note (Signed)
History and Physical Interval Note: Discussed possibility of staged procedure.   07/12/2022 8:38 AM  Kelly Reyes  has presented today for surgery, with the diagnosis of RIGHT RENAL CALCULUS.  The various methods of treatment have been discussed with the patient and family. After consideration of risks, benefits and other options for treatment, the patient has consented to  Procedure(s) with comments: CYSTOSCOPY WITH RETROGRADE PYELOGRAM, URETEROSCOPY AND STENT PLACEMENT (Right) - 39 MINS HOLMIUM  LASER APPLICATION (Right) as a surgical intervention.  The patient's history has been reviewed, patient examined, no change in status, stable for surgery.  I have reviewed the patient's chart and labs.  Questions were answered to the patient's satisfaction.     Natoria Archibald D Ranbir Chew

## 2022-07-12 NOTE — Op Note (Signed)
Operative Note  Preoperative diagnosis:  1.  Right renal caclulus 2.  Right hydronephrosis 3.  Urethral stricture   Postoperative diagnosis: 1.  Right renal calculus 2.  Right hydronephrosis 3.  Urethral stricture  Procedure(s): 1.  Cystoscopy with right retrograde pyelogram 2.  Urethral dilation  Surgeon: Jacalyn Lefevre, MD  Assistants:  None  Anesthesia:  General  Complications:  None  EBL:  none  Specimens: 1. none  Drains/Catheters: 1.  none  Intraoperative findings:   Urethral stricture dilated from 16Fr - 26Fr Bilateral orthotopic Uos Normal bladder mucosa without masses or abnormalities Right retrograde peylgoram   Indication:  Kelly Reyes is a 77 y.o. female who initially presented with actue on chronic kidney failure found to have 2 cm right UPJ calculus.   Description of procedure: After risks and benefits of the procedure discussed with the patient, informed consent was obtained.  Patient was taken to the operating room placed in the supine position.  Anesthesia was induced and antibiotics were administered.  The patient was then repositioned in the dorsolithotomy position.  Care was taken to use minimal abduction when putting her lithotomy due to recent hip surgery.  She was prepped and draped in the usual sterile fashion and timeout was performed.  The urethral meatus was then dilated from 13 [place] to 54 [place] with female sounds.  Next a 26 French rigid cystoscope was placed in the urethral meatus and advanced into the bladder under direct visualization.  Diagnostic cystoscopy then took place and no abnormalities were noted.  The right ureteral orifice was cannulated with a sensor wire.  An open-ended ureteral catheter was then placed over the sensor wire and the sensor wire was removed.  A right retrograde pyelogram was obtained showed a large filling defect consistent with the known 2 cm right UPJ calculus as well as hydronephrosis proximal.   Attempts at placing a sensor or Glidewire up to the kidney past the stone were met with obstruction.  The decision was made to end the procedure as a safety wire could not be placed.  The patient's bladder was decompressed and she emerged from anesthesia.  She was transferred the PACU in stable condition.   Plan: The patient will be admitted overnight and plan for right percutaneous nephrostomy tube so that a staged procedure can be performed.

## 2022-07-12 NOTE — Transfer of Care (Signed)
Immediate Anesthesia Transfer of Care Note  Patient: Kelly Reyes  Procedure(s) Performed: Procedure(s) with comments: CYSTOSCOPY WITH RETROGRADE PYELOGRAM, URETHRAL DILATION (Right) - 20 MINS  Patient Location: PACU  Anesthesia Type:General  Level of Consciousness: Alert, Awake, Oriented  Airway & Oxygen Therapy: Patient Spontanous Breathing  Post-op Assessment: Report given to RN  Post vital signs: Reviewed and stable  Last Vitals:  Vitals:   07/12/22 0712 07/12/22 0947  BP: 138/86 115/74  Pulse: 75 77  Resp: 15 13  Temp: 36.6 C (!) 36.4 C  SpO2: 123XX123 [id number]    Complications: No apparent anesthesia complications

## 2022-07-12 NOTE — Anesthesia Preprocedure Evaluation (Signed)
Anesthesia Evaluation  Patient identified by MRN, date of birth, ID band Patient awake    Reviewed: Allergy & Precautions, H&P , NPO status , Patient's Chart, lab work & pertinent test results  Airway Mallampati: II   Neck ROM: full    Dental   Pulmonary neg pulmonary ROS   breath sounds clear to auscultation       Cardiovascular hypertension,  Rhythm:regular Rate:Normal     Neuro/Psych    GI/Hepatic   Endo/Other    Renal/GU Renal InsufficiencyRenal diseasestones     Musculoskeletal  (+) Arthritis ,    Abdominal   Peds  Hematology   Anesthesia Other Findings   Reproductive/Obstetrics                             Anesthesia Physical Anesthesia Plan  ASA: 2  Anesthesia Plan: General   Post-op Pain Management:    Induction: Intravenous  PONV Risk Score and Plan: 3 and Ondansetron, Dexamethasone and Treatment may vary due to age or medical condition  Airway Management Planned: LMA  Additional Equipment:   Intra-op Plan:   Post-operative Plan: Extubation in OR  Informed Consent: I have reviewed the patients History and Physical, chart, labs and discussed the procedure including the risks, benefits and alternatives for the proposed anesthesia with the patient or authorized representative who has indicated his/her understanding and acceptance.     Dental advisory given  Plan Discussed with: CRNA, Anesthesiologist and Surgeon  Anesthesia Plan Comments:        Anesthesia Quick Evaluation

## 2022-07-13 ENCOUNTER — Observation Stay (HOSPITAL_COMMUNITY): Payer: Medicare Other

## 2022-07-13 ENCOUNTER — Encounter (HOSPITAL_COMMUNITY): Payer: Self-pay | Admitting: Urology

## 2022-07-13 DIAGNOSIS — N1330 Unspecified hydronephrosis: Secondary | ICD-10-CM | POA: Diagnosis not present

## 2022-07-13 DIAGNOSIS — N3592 Unspecified urethral stricture, female: Secondary | ICD-10-CM | POA: Diagnosis not present

## 2022-07-13 DIAGNOSIS — N130 Hydronephrosis with ureteropelvic junction obstruction: Secondary | ICD-10-CM | POA: Diagnosis not present

## 2022-07-13 DIAGNOSIS — N189 Chronic kidney disease, unspecified: Secondary | ICD-10-CM | POA: Diagnosis not present

## 2022-07-13 DIAGNOSIS — Z79899 Other long term (current) drug therapy: Secondary | ICD-10-CM | POA: Diagnosis not present

## 2022-07-13 DIAGNOSIS — N200 Calculus of kidney: Secondary | ICD-10-CM | POA: Diagnosis not present

## 2022-07-13 DIAGNOSIS — I129 Hypertensive chronic kidney disease with stage 1 through stage 4 chronic kidney disease, or unspecified chronic kidney disease: Secondary | ICD-10-CM | POA: Diagnosis not present

## 2022-07-13 DIAGNOSIS — N132 Hydronephrosis with renal and ureteral calculous obstruction: Secondary | ICD-10-CM | POA: Diagnosis not present

## 2022-07-13 DIAGNOSIS — Z96641 Presence of right artificial hip joint: Secondary | ICD-10-CM | POA: Diagnosis not present

## 2022-07-13 HISTORY — PX: IR NEPHROSTOMY PLACEMENT RIGHT: IMG6064

## 2022-07-13 LAB — CBC
HCT: 36.7 % (ref 36.0–46.0)
Hemoglobin: 12.4 g/dL (ref 12.0–15.0)
MCH: 29 pg (ref 26.0–34.0)
MCHC: 33.8 g/dL (ref 30.0–36.0)
MCV: 85.9 fL (ref 80.0–100.0)
Platelets: 194 10*3/uL (ref 150–400)
RBC: 4.27 MIL/uL (ref 3.87–5.11)
RDW: 14 % (ref 11.5–15.5)
WBC: 14.6 10*3/uL — ABNORMAL HIGH (ref 4.0–10.5)
nRBC: 0 % (ref 0.0–0.2)

## 2022-07-13 LAB — PROTIME-INR
INR: 1.2 (ref 0.8–1.2)
Prothrombin Time: 14.7 seconds (ref 11.4–15.2)

## 2022-07-13 MED ORDER — MIDAZOLAM HCL 2 MG/2ML IJ SOLN
INTRAMUSCULAR | Status: AC
  Filled 2022-07-13: qty 2

## 2022-07-13 MED ORDER — CEPHALEXIN 250 MG PO CAPS: 250.0000 mg | ORAL_CAPSULE | Freq: Every day | ORAL | 0 refills | Status: AC

## 2022-07-13 MED ORDER — SODIUM CHLORIDE 0.9 % IV SOLN
INTRAVENOUS | Status: AC
  Filled 2022-07-13: qty 20

## 2022-07-13 MED ORDER — TRAMADOL HCL 50 MG PO TABS: 50.0000 mg | ORAL_TABLET | Freq: Four times a day (QID) | ORAL | 0 refills | Status: DC | PRN

## 2022-07-13 MED ORDER — DIPHENHYDRAMINE HCL 50 MG/ML IJ SOLN
INTRAMUSCULAR | Status: AC
  Filled 2022-07-13: qty 1

## 2022-07-13 MED ORDER — DIPHENHYDRAMINE HCL 50 MG/ML IJ SOLN
INTRAMUSCULAR | Status: AC | PRN
  Administered 2022-07-13: 50 mg via INTRAVENOUS

## 2022-07-13 MED ORDER — IOHEXOL 300 MG/ML  SOLN
50.0000 mL | Freq: Once | INTRAMUSCULAR | Status: AC | PRN
  Administered 2022-07-13: 7 mL

## 2022-07-13 MED ORDER — FENTANYL CITRATE (PF) 100 MCG/2ML IJ SOLN
INTRAMUSCULAR | Status: AC | PRN
  Administered 2022-07-13: 25 ug via INTRAVENOUS
  Administered 2022-07-13: 75 ug via INTRAVENOUS

## 2022-07-13 MED ORDER — LIDOCAINE-EPINEPHRINE 1 %-1:100000 IJ SOLN
INTRAMUSCULAR | Status: AC
  Administered 2022-07-13: 7 mL
  Filled 2022-07-13: qty 1

## 2022-07-13 MED ORDER — MIDAZOLAM HCL 2 MG/2ML IJ SOLN
INTRAMUSCULAR | Status: AC | PRN
  Administered 2022-07-13: .5 mg via INTRAVENOUS
  Administered 2022-07-13: 1.5 mg via INTRAVENOUS

## 2022-07-13 MED ORDER — FENTANYL CITRATE (PF) 100 MCG/2ML IJ SOLN
INTRAMUSCULAR | Status: AC
  Filled 2022-07-13: qty 2

## 2022-07-13 NOTE — Sedation Documentation (Signed)
Report called to [place], RN on 4W

## 2022-07-13 NOTE — Discharge Summary (Signed)
Date of admission: 07/12/2022  Date of discharge: 07/13/2022  Admission diagnosis: right renal calculus, right hydronephrosis  Discharge diagnosis: right renal calculus, right hydronephrosis  Secondary diagnoses:  Patient Active Problem List   Diagnosis Date Noted   Renal calculus, right 07/12/2022   Primary hypertension 09/14/2018    Procedures performed: Procedure(s): CYSTOSCOPY WITH RETROGRADE PYELOGRAM, URETHRAL DILATION  History and Physical: For full details, please see admission history and physical. Briefly, Kelly Reyes is a 77 y.o. year old patient with 2 cm right UPJ calculus and hydronephrosis.   Patient underwent attempted right ureteroscopy however a safety wire was not able to navigate past the UPJ calculus.  The decision was made to abort the procedure and admit patient for right nephrostomy tube.  On postop day 1 a right percutaneous nephrostomy tube was placed by interventional radiology without difficulty.  Hospital Course: Procedure was aborted and she was admitted after right retrograde pyelogram and inability to place safety wire.  She was then transferred to the floor after an uneventful PACU stay.  Her hospital course was uncomplicated.  On POD#1 she had met discharge criteria: was eating a regular diet, was up and ambulating independently,  pain was well controlled, was voiding without a catheter, and was ready to for discharge.   Laboratory values:  Recent Labs    07/13/22 0415  WBC 14.6*  HGB 12.4  HCT 36.7   Recent Labs    07/12/22 0833  NA 140  K 3.2*  CL 102  CO2 26  GLUCOSE 98  BUN 27*  CREATININE 1.60*  CALCIUM 9.3   Recent Labs    07/12/22 1013 07/13/22 0415  INR 1.2 1.2   No results for input(s): "LABURIN" in the last 72 hours. No results found for this or any previous visit.  Disposition: Home  Discharge instruction: The patient was instructed to be ambulatory but told to refrain from heavy lifting, strenuous activity, or  driving.   Interventional radiology will attempt to obtain access in the antegrade fashion so that ureteroscopy may be performed in the future.  Discharge medications:  Allergies as of 07/13/2022       Reactions   Codeine Nausea Only   Oxycodone Nausea Only        Medication List     TAKE these medications    amLODipine 5 MG tablet Commonly known as: NORVASC Take 5 mg by mouth in the morning.   cefdinir 300 MG capsule Commonly known as: OMNICEF Take 300 mg by mouth 2 (two) times daily. Patient unsure if this is the antibiotic she took.   cholecalciferol 25 MCG (1000 UNIT) tablet Commonly known as: VITAMIN D3 Take 1,000 Units by mouth daily.   rosuvastatin 10 MG tablet Commonly known as: CRESTOR Take 1 tablet by mouth at bedtime.   telmisartan-hydrochlorothiazide 40-12.5 MG tablet Commonly known as: MICARDIS HCT TAKE 1 TABLET BY MOUTH ONCE DAILY   traMADol 50 MG tablet Commonly known as: Ultram Take 1 tablet (50 mg total) by mouth every 6 (six) hours as needed.        Followup:   Follow-up Information     [facility] Follow up.   Contact information: [place] [phone]

## 2022-07-13 NOTE — Progress Notes (Signed)
Urology Inpatient Progress Report  Renal calculus, right [N20.0]  Procedure(s): CYSTOSCOPY WITH RETROGRADE PYELOGRAM, URETHRAL DILATION  1 Day Post-Op   Intv/Subj: No acute events overnight.  She underwent right PCN placement today by radiology.  She is doing well.  Principal Problem:   Renal calculus, right  Current Facility-Administered Medications  Medication Dose Route Frequency Provider Last Rate Last Admin   amLODipine (NORVASC) tablet 5 mg  5 mg Oral q AM Jacalyn Lefevre D, MD   5 mg at 07/13/22 0843   irbesartan (AVAPRO) tablet 150 mg  150 mg Oral Daily Jacalyn Lefevre D, MD   150 mg at 07/13/22 [id number]   And   hydrochlorothiazide (HYDRODIURIL) tablet 12.5 mg  12.5 mg Oral Daily Jacalyn Lefevre D, MD   12.5 mg at 07/13/22 0842   rosuvastatin (CRESTOR) tablet 10 mg  10 mg Oral QHS Jacalyn Lefevre D, MD   10 mg at 07/12/22 2133     Objective: Vital: Vitals:   07/13/22 1215 07/13/22 1220 07/13/22 1235 07/13/22 1310  BP: 106/67 100/67 105/70 94/65  Pulse: 82 83 77 60  Resp: '16 15 17 18  '$ Temp:   97.9 F (36.6 C)   TempSrc:   Oral   SpO2: 99% 99% 98%   Weight:      Height:       I/Os: I/O last 3 completed shifts: In: 60 [P.O.:480; I.V.:800; IV Piggyback:100] Out: -   Physical Exam:  General: Patient is in no apparent distress Lungs: Normal respiratory effort, chest expands symmetrically. GI:The abdomen is soft and nontender without mass. GU: Right PCN tube draining pink-tinged urine without clots, dressing clean dry and intact Ext: lower extremities symmetric  Lab Results: Recent Labs    07/13/22 0415  WBC 14.6*  HGB 12.4  HCT 36.7   Recent Labs    07/12/22 0833  NA 140  K 3.2*  CL 102  CO2 26  GLUCOSE 98  BUN 27*  CREATININE 1.60*  CALCIUM 9.3   Recent Labs    07/12/22 1013 07/13/22 0415  INR 1.2 1.2   No results for input(s): "LABURIN" in the last 72 hours. No results found for this or any previous visit.  Studies/Results: IR  NEPHROSTOMY PLACEMENT RIGHT  Result Date: 07/13/2022 CLINICAL DATA:  Obstructing right UVJ calculus EXAM: 1. ULTRASOUND GUIDANCE FOR PUNCTURE OF THE RIGHT RENAL COLLECTING SYSTEM. 2. RIGHT PERCUTANEOUS NEPHROSTOMY TUBE PLACEMENT. COMPARISON:  None Available. ANESTHESIA/SEDATION: 2.0 mg IV Versed; 100 mcg IV Fentanyl.  50 mg Benadryl IV Total Moderate Sedation Time 13.0 minutes CONTRAST:  7.0 ml Omnipaque 300 MEDICATIONS: 1% lidocaine local. FLUOROSCOPY TIME:  Two mGy PROCEDURE: The procedure, risks, benefits, and alternatives were explained to the patient. Questions regarding the procedure were encouraged and answered. The patient understands and consents to the procedure. The right flank region was prepped with ChloraPrep in a sterile fashion, and a sterile drape was applied covering the operative field. A sterile gown and sterile gloves were used for the procedure. Local anesthesia was provided with 1% Lidocaine. Ultrasound was used to localize the right kidney. Under direct ultrasound guidance, a 18 gauge needle was advanced into mid to lower pole posterior dilated calyx. There was return of urine. Ultrasound image documentation was performed. Aspiration of urine sample was performed followed by contrast injection. A transitional dilator was advanced over a guidewire. Percutaneous tract dilatation was then performed over the guidewire. A 10-French percutaneous nephrostomy tube was then advanced and formed in the collecting system. Catheter position was  confirmed by fluoroscopy after contrast injection. The catheter was secured at the skin with a 0 silk retention suture and Stat-Lock device. A gravity bag was placed. COMPLICATIONS: None. FINDINGS: Imaging confirms right nephrostomy placement IMPRESSION: Successful ultrasound and fluoroscopic guided right percutaneous nephrostomy catheter placement. Electronically Signed   By: Jerilynn Mages.  Shick M.D.   On: 07/13/2022 13:11   DG C-Arm 1-60 Min-No Report  Result Date:  07/12/2022 Fluoroscopy was utilized by the requesting physician.  No radiographic interpretation.    Assessment: Procedure(s): CYSTOSCOPY WITH RETROGRADE PYELOGRAM, URETHRAL DILATION, 1 Day Post-Op  doing well.  Plan: -Right PCN tube placed without difficulty today -IR to plan and attempt antegrade access past the stone in 1 to 2 weeks -Tramadol for pain -Daily cephalexin for UTI prevention with PCN -Discharge today   Jacalyn Lefevre, MD Urology 07/13/2022, 1:40 PM

## 2022-07-13 NOTE — Discharge Instructions (Addendum)
Discharge instructions following right percutaneous nephrostomy tube (PCN)  Call your doctor for: Fevers greater than 100.5 Severe nausea or vomiting Increasing pain not controlled by pain medication Increasing redness or drainage from incisions Decreased urine output or a catheter is no longer draining  The number for questions is [phone].  Activity: Gradually increase activity with short frequent walks, 3-4 times a day.  Avoid strenuous activities, like sports, lawn-mowing, or heavy lifting (more than 10-15 pounds).  Wear loose, comfortable clothing that pull or kink the tube or tubes.  Do not drive while taking pain medication, or until your doctor permitts it.  Bathing and dressing changes: You should not shower for 48 hours after surgery.  Do not soak your back in a bathtub.  Drainage bag care: You may be discharged with a drainage bag around the site of your surgery.  The drainage bag should be secured such that it never pulls or loosens to prevent it from leaking.  It is important to wash her hands before and after emptying the drainage bag to help prevent the spread of infection.  The drainage bag should be emptied as needed.  When the wound stops draining or it is manageable with a dry gauze dressing, you can remove the bag.  Diet: It is extremely important to drink plenty of fluids after surgery, especially water.  You may resume your regular diet, unless otherwise instructed.  Medications: May take Tylenol (acetaminophen) or ibuprofen (Advil, Motrin) as directed over-the-counter. Take any prescriptions as directed.  Follow-up appointments: Follow-up appointment will be scheduled with Dr. Claudia Desanctis

## 2022-07-13 NOTE — Procedures (Signed)
Interventional Radiology Procedure Note  Procedure: RT PCN    Complications: None  Estimated Blood Loss:  MIN  Findings: 10FR    Tamera Punt, MD

## 2022-07-13 NOTE — Progress Notes (Signed)
Pt given discharge instructions and supplies for dressing changes.  Pt belongings returned.  Pt taken to front entrance in wheelchair.  Husband provided transportation.

## 2022-07-20 DIAGNOSIS — N132 Hydronephrosis with renal and ureteral calculous obstruction: Secondary | ICD-10-CM | POA: Diagnosis not present

## 2022-07-21 ENCOUNTER — Other Ambulatory Visit (HOSPITAL_COMMUNITY): Payer: Self-pay | Admitting: Urology

## 2022-07-21 DIAGNOSIS — N135 Crossing vessel and stricture of ureter without hydronephrosis: Secondary | ICD-10-CM

## 2022-08-02 DIAGNOSIS — M25651 Stiffness of right hip, not elsewhere classified: Secondary | ICD-10-CM | POA: Diagnosis not present

## 2022-08-02 DIAGNOSIS — N200 Calculus of kidney: Secondary | ICD-10-CM | POA: Diagnosis not present

## 2022-08-02 DIAGNOSIS — N1831 Chronic kidney disease, stage 3a: Secondary | ICD-10-CM | POA: Diagnosis not present

## 2022-08-02 DIAGNOSIS — J40 Bronchitis, not specified as acute or chronic: Secondary | ICD-10-CM | POA: Diagnosis not present

## 2022-08-02 DIAGNOSIS — Z03818 Encounter for observation for suspected exposure to other biological agents ruled out: Secondary | ICD-10-CM | POA: Diagnosis not present

## 2022-08-05 ENCOUNTER — Other Ambulatory Visit (HOSPITAL_COMMUNITY): Payer: Medicare Other

## 2022-08-05 ENCOUNTER — Ambulatory Visit (HOSPITAL_COMMUNITY): Payer: Medicare Other

## 2022-08-11 ENCOUNTER — Other Ambulatory Visit: Payer: Self-pay | Admitting: Radiology

## 2022-08-11 DIAGNOSIS — N135 Crossing vessel and stricture of ureter without hydronephrosis: Secondary | ICD-10-CM

## 2022-08-15 ENCOUNTER — Other Ambulatory Visit: Payer: Self-pay

## 2022-08-15 ENCOUNTER — Ambulatory Visit (HOSPITAL_COMMUNITY)
Admission: RE | Admit: 2022-08-15 | Discharge: 2022-08-15 | Disposition: A | Discharge status: Home / Self Care | Payer: Medicare Other | Source: Ambulatory Visit | Attending: Urology | Admitting: Urology

## 2022-08-15 ENCOUNTER — Encounter (HOSPITAL_COMMUNITY): Payer: Self-pay

## 2022-08-15 DIAGNOSIS — N200 Calculus of kidney: Secondary | ICD-10-CM | POA: Diagnosis not present

## 2022-08-15 DIAGNOSIS — N132 Hydronephrosis with renal and ureteral calculous obstruction: Secondary | ICD-10-CM | POA: Insufficient documentation

## 2022-08-15 DIAGNOSIS — I129 Hypertensive chronic kidney disease with stage 1 through stage 4 chronic kidney disease, or unspecified chronic kidney disease: Secondary | ICD-10-CM | POA: Insufficient documentation

## 2022-08-15 DIAGNOSIS — Z87442 Personal history of urinary calculi: Secondary | ICD-10-CM | POA: Insufficient documentation

## 2022-08-15 DIAGNOSIS — N1330 Unspecified hydronephrosis: Secondary | ICD-10-CM | POA: Diagnosis not present

## 2022-08-15 DIAGNOSIS — N135 Crossing vessel and stricture of ureter without hydronephrosis: Secondary | ICD-10-CM

## 2022-08-15 DIAGNOSIS — N189 Chronic kidney disease, unspecified: Secondary | ICD-10-CM | POA: Insufficient documentation

## 2022-08-15 HISTORY — PX: IR URETERAL STENT PLACEMENT EXISTING ACCESS RIGHT: IMG6074

## 2022-08-15 LAB — BASIC METABOLIC PANEL
Anion gap: 17 — ABNORMAL HIGH (ref 5–15)
BUN: 20 mg/dL (ref 8–23)
CO2: 24 mmol/L (ref 22–32)
Calcium: 9.8 mg/dL (ref 8.9–10.3)
Chloride: 102 mmol/L (ref 98–111)
Creatinine, Ser: 1.35 mg/dL — ABNORMAL HIGH (ref 0.44–1.00)
GFR, Estimated: 41 mL/min — ABNORMAL LOW (ref >60)
Glucose, Bld: 92 mg/dL (ref 70–99)
Potassium: 3.6 mmol/L (ref 3.5–5.1)
Sodium: 143 mmol/L (ref 135–145)

## 2022-08-15 LAB — CBC WITH DIFFERENTIAL/PLATELET
Abs Immature Granulocytes: 0.02 10*3/uL (ref 0.00–0.07)
Basophils Absolute: 0.1 10*3/uL (ref 0.0–0.1)
Basophils Relative: 1 %
Eosinophils Absolute: 0.2 10*3/uL (ref 0.0–0.5)
Eosinophils Relative: 2 %
HCT: 39.9 % (ref 36.0–46.0)
Hemoglobin: 12.8 g/dL (ref 12.0–15.0)
Immature Granulocytes: 0 %
Lymphocytes Relative: 30 %
Lymphs Abs: 2.6 10*3/uL (ref 0.7–4.0)
MCH: 27.5 pg (ref 26.0–34.0)
MCHC: 32.1 g/dL (ref 30.0–36.0)
MCV: 85.6 fL (ref 80.0–100.0)
Monocytes Absolute: 0.5 10*3/uL (ref 0.1–1.0)
Monocytes Relative: 6 %
Neutro Abs: 5.2 10*3/uL (ref 1.7–7.7)
Neutrophils Relative %: 61 %
Platelets: 258 10*3/uL (ref 150–400)
RBC: 4.66 MIL/uL (ref 3.87–5.11)
RDW: 14.5 % (ref 11.5–15.5)
WBC: 8.5 10*3/uL (ref 4.0–10.5)
nRBC: 0 % (ref 0.0–0.2)

## 2022-08-15 LAB — PROTIME-INR
INR: 1.1 (ref 0.8–1.2)
Prothrombin Time: 14 seconds (ref 11.4–15.2)

## 2022-08-15 MED ORDER — SODIUM CHLORIDE 0.9 % IV SOLN
2.0000 g | Freq: Once | INTRAVENOUS | Status: AC
  Administered 2022-08-15: 2 g via INTRAVENOUS

## 2022-08-15 MED ORDER — LIDOCAINE HCL 1 % IJ SOLN: 10.0000 mL | Freq: Once | INTRAMUSCULAR | Status: DC

## 2022-08-15 MED ORDER — MIDAZOLAM HCL 2 MG/2ML IJ SOLN
INTRAMUSCULAR | Status: AC
  Filled 2022-08-15: qty 2

## 2022-08-15 MED ORDER — SODIUM CHLORIDE 0.9 % IV SOLN
INTRAVENOUS | Status: AC
  Filled 2022-08-15: qty 20

## 2022-08-15 MED ORDER — HYDROCODONE-ACETAMINOPHEN 5-325 MG PO TABS: 1.0000 | ORAL_TABLET | ORAL | Status: DC | PRN

## 2022-08-15 MED ORDER — IOHEXOL 300 MG/ML  SOLN
50.0000 mL | Freq: Once | INTRAMUSCULAR | Status: AC | PRN
  Administered 2022-08-15: 15 mL

## 2022-08-15 MED ORDER — LIDOCAINE HCL 1 % IJ SOLN
INTRAMUSCULAR | Status: AC
  Filled 2022-08-15: qty 20

## 2022-08-15 MED ORDER — FENTANYL CITRATE (PF) 100 MCG/2ML IJ SOLN
INTRAMUSCULAR | Status: AC
  Filled 2022-08-15: qty 2

## 2022-08-15 MED ORDER — MIDAZOLAM HCL 2 MG/2ML IJ SOLN
INTRAMUSCULAR | Status: AC | PRN
  Administered 2022-08-15 (×4): .5 mg via INTRAVENOUS

## 2022-08-15 MED ORDER — FENTANYL CITRATE (PF) 100 MCG/2ML IJ SOLN
INTRAMUSCULAR | Status: AC | PRN
  Administered 2022-08-15 (×4): 25 ug via INTRAVENOUS

## 2022-08-15 NOTE — Procedures (Signed)
  Procedure:  Right double-J ureteral stent 26cm 8.62F, Right perc nephrostomy exchange 75F   capped to allow internal drainage as tolerated Preprocedure diagnosis: The encounter diagnosis was Obstruction of ureter, unspecified laterality. Postprocedure diagnosis: same EBL:    minimal Complications:   none immediate  See full dictation in [employer].  Thora Lance MD Main # [phone] Pager  [phone] Mobile [phone]

## 2022-08-15 NOTE — Progress Notes (Signed)
Discharge instructions given to patient and pt's spouse. They verbalized understanding. IV removed. Right flank dressing clean dry and intact. Pt ambulated with standby assist to bathroom. Pt transported to main entrance via wheelchair accompanied by RN. Pt's spouse picked up patient in private vehicle and discharged home. No s/s of distress at this time.

## 2022-08-15 NOTE — H&P (Signed)
Referring Physician(s): Pace,Maryellen D  Supervising Physician: Oley Balm  Patient Status:  [facility] OP  Chief Complaint:  Right renal stone, hydronephrosis  Subjective: Pt known to IR team from right PCN on 07/13/22. She is a 77 yo female with hx 2 cm obstructing right UPJ stone , hydronephrosis, urethral stricture, CKD, HTN; urology was unable to place ureteral stent on 07/12/22 and she presents today for antegrade attempt at ureteral stent placement via her PCN. She denies fever,HA,CP,dyspnea, cough, abd/back pain, N/V  or bleeding; she does have some feet edema.    Past Medical History:  Diagnosis Date   Arthritis    Chronic kidney disease    History of kidney stones    Hyperlipidemia    Hypertension    Palpitations    Past Surgical History:  Procedure Laterality Date   COLONOSCOPY     x2   CYSTOSCOPY WITH RETROGRADE PYELOGRAM, URETEROSCOPY AND STENT PLACEMENT Right 07/12/2022   Procedure: CYSTOSCOPY WITH RETROGRADE PYELOGRAM, URETHRAL DILATION;  Surgeon: Noel Christmas, MD;  Location: [facility]S;  Service: Urology;  Laterality: Right;  75 MINS   HIP ARTHROPLASTY Right 2024   IR NEPHROSTOMY PLACEMENT RIGHT  07/13/2022   TUBAL LIGATION     WISDOM TOOTH EXTRACTION        Allergies: Codeine and Oxycodone  Medications: Prior to Admission medications   Medication Sig Start Date End Date Taking? Authorizing Provider  amLODipine (NORVASC) 5 MG tablet Take 5 mg by mouth in the morning.   Yes [provider]  cefdinir (OMNICEF) 300 MG capsule Take 300 mg by mouth 2 (two) times daily. Patient unsure if this is the antibiotic she took.   Yes [provider]  rosuvastatin (CRESTOR) 10 MG tablet Take 1 tablet by mouth at bedtime. 09/01/21  Yes [provider]  telmisartan-hydrochlorothiazide (MICARDIS HCT) 40-12.5 MG tablet TAKE 1 TABLET BY MOUTH ONCE DAILY 12/25/15  Yes [provider]  cholecalciferol (VITAMIN D3) 25 MCG (1000 UT) tablet  Take 1,000 Units by mouth daily.    [provider]  traMADol (ULTRAM) 50 MG tablet Take 1 tablet (50 mg total) by mouth every 6 (six) hours as needed. 07/13/22 07/13/23  Noel Christmas, MD     Vital Signs: BP (!) 141/94   Pulse 81   Temp 98 F (36.7 C) (Oral)   Resp 16   Ht  (1.676 m)   Wt 122 lb (55.3 kg)   SpO2 97%   BMI 19.69 kg/m    Code Status: FULL CODE  Physical Exam: awake/alert; chest- CTA bilat; heart- RRR; abd- soft,+BS,NT; right PCN intact draining yellow urine; trace pretibial edema primarily left sided  Imaging: No results found.  Labs:  CBC: Recent Labs    07/06/22 1005 07/13/22 0415 08/15/22 1300  WBC 6.8 14.6* 8.5  HGB 12.8 12.4 12.8  HCT 40.5 36.7 39.9  PLT 231 194 258    COAGS: Recent Labs    07/12/22 1013 07/13/22 0415 08/15/22 1300  INR 1.2 1.2 1.1    BMP: Recent Labs    07/06/22 1005 07/12/22 0833 08/15/22 1300  NA 141 140 143  K 3.9 3.2* 3.6  CL 103 102 102  CO2 GLUCOSE 98 98 92  BUN 29* 27* 20  CALCIUM 9.4 9.3 9.8  CREATININE 1.57* 1.60* 1.35*  GFRNONAA 34* 34* 41*    LIVER FUNCTION TESTS: No results for input(s): "BILITOT", "AST", "ALT", "ALKPHOS", "PROT", "ALBUMIN" in the last 8760  hours.  Assessment and Plan: 77 yo female with hx 2 cm obstructing right UPJ stone , hydronephrosis, urethral stricture, CKD, HTN; urology was unable to place ureteral stent on 07/12/22 and she presents today for antegrade attempt at ureteral stent placement via her PCN. She is s/p right PCN on 07/13/22 by our team. Details/risks of procedure, incl but not limited to, internal bleeding , infection, injury to adjacent structures,inability to place stent d/w pt/spouse with their understanding and consent.  Creat today 1.35(1.60)   Electronically Signed: D. Jeananne Rama, PA-C 08/15/2022, 1:59 PM   I spent a total of 20 minutes at the the patient's bedside AND on the patient's hospital floor or unit, greater than 50%  of which was counseling/coordinating care for right ureteral stent placement

## 2022-08-15 NOTE — Discharge Instructions (Addendum)
Please call Interventional Radiology clinic [phone] with any questions or concerns.   Wound - May remove dressing in 24 to 48 hours and clean insertion site, as directed (daily x 7 days;  alcohol swab / soap and water and replace dressing).  When site is completely dry replace dressing .  Do not submerge in tub or water. After several days,may shower if you completely cover site as to not get wet.    Place to external gravity drain bag if worsening pain, leaking around site, fever 101.5, chills.  Hydrate.  Percutaneous Nephrostomy / Stent Placement, Care After This sheet gives you information about how to care for yourself after your procedure. Your health care provider may also give you more specific instructions. If you have problems or questions, contact your health careprovider. What can I expect after the procedure? After the procedure, it is common to have: Some soreness where the nephrostomy tube was inserted (tube insertion site). Blood-tinged drainage from the nephrostomy tube for the first 24 hours. Follow these instructions at home: Activity  Do not lift anything that is heavier than 10 lb (4.5 kg), or the limit that you are told, until your health care provider says that it is safe. Return to your normal activities as told by your health care provider. Ask your health care provider what activities are safe for you. Avoid activities that may cause the nephrostomy tubing to bend. Do not take baths, swim, or use a hot tub until your health care provider approves. Ask your health care provider if you can take showers. Cover the nephrostomy tube bandage (dressing) with a watertight covering when you take a shower. If you were given a sedative during the procedure, it can affect you for several hours. Do not drive or operate machinery until your health care provider says that it is safe.   Care of the tube insertion site  Follow instructions from your health care provider about  how to take care of your tube insertion site. Make sure you: Wash your hands with soap and water for at least 20 seconds before you change your dressing. If soap and water are not available, use hand sanitizer. Change your dressing as told by your health care provider. Be careful not to pull on the tube while removing the dressing. When you change the dressing, wash the skin around the tube, rinse well, and pat the skin dry. Check the tube insertion area every day for signs of infection. Check for: Redness, swelling, or pain. Fluid or blood. Warmth. Pus or a bad smell.   Care of the nephrostomy tube and drainage bag Always keep the tubing, the leg bag, or the bedside drainage bags below the level of the kidney so that your urine drains freely. When connecting your nephrostomy tube to a drainage bag, make sure that there are no kinks in the tubing and that your urine is draining freely. You may want to use an elastic bandage to wrap any exposed tubing that goes from the nephrostomy tube to any of the connecting tubes. At night, you may want to connect your nephrostomy tube or the leg bag to a larger bedside drainage bag. Follow instructions from your health care provider about how to empty or change the drainage bag. Empty the drainage bag when it becomes ? full. Replace the drainage bag and any extension tubing that is connected to your nephrostomy tube every 7 days or as told by your health care provider. Your health care provider will  explain how to change the drainage bag and extension tubing. General instructions Take over-the-counter and prescription medicines only as told by your health care provider. Keep all follow-up visits as told by your health care provider. This is important. The nephrostomy tube will need to be changed every 8-12 weeks. Contact a health care provider if: You have problems with any of the valves or tubing. You have persistent pain or soreness in your back. You  have redness, swelling, or pain around your tube insertion site. You have fluid or blood coming from your tube insertion site. Your tube insertion site feels warm to the touch. You have pus or a bad smell coming from your tube insertion site. You have increased urine output or you feel burning when urinating. Get help right away if: You have pain in your abdomen during the first week. You have chest pain or have trouble breathing. You have a new appearance of blood in your urine. You have a fever or chills. You have back pain that is not relieved by your medicine. You have decreased urine output. Your nephrostomy tube comes out. Summary After the procedure, it is common to have some soreness where the nephrostomy tube was inserted (tube insertion site). Follow instructions from your health care provider about how to take care of your tube insertion site, nephrostomy tube, and drainage bag. Keep all follow-up visits for care and for changing the tube. This information is not intended to replace advice given to you by your health care provider. Make sure you discuss any questions you have with your healthcare provider. Document Revised: 05/14/2019 Document Reviewed: 05/14/2019 Elsevier Patient Education  2022 [employer]   Moderate Conscious Sedation, Adult, Care After This sheet gives you information about how to care for yourself after your procedure. Your health care provider may also give you more specific instructions. If you have problems or questions, contact your health care provider. What can I expect after the procedure? After the procedure, it is common to have: Sleepiness for several hours. Impaired judgment for several hours. Difficulty with balance. Vomiting if you eat too soon. Follow these instructions at home: For the time period you were told by your health care provider: Rest. Do not participate in activities where you could fall or become injured. Do not drive  or use machinery. Do not drink alcohol. Do not take sleeping pills or medicines that cause drowsiness. Do not make important decisions or sign legal documents. Do not take care of children on your own.      Eating and drinking Follow the diet recommended by your health care provider. Drink enough fluid to keep your urine pale yellow. If you vomit: Drink water, juice, or soup when you can drink without vomiting. Make sure you have little or no nausea before eating solid foods.   General instructions Take over-the-counter and prescription medicines only as told by your health care provider. Have a responsible adult stay with you for the time you are told. It is important to have someone help care for you until you are awake and alert. Do not smoke. Keep all follow-up visits as told by your health care provider. This is important. Contact a health care provider if: You are still sleepy or having trouble with balance after 24 hours. You feel light-headed. You keep feeling nauseous or you keep vomiting. You develop a rash. You have a fever. You have redness or swelling around the IV site. Get help right away if: You have trouble  breathing. You have new-onset confusion at home. Summary After the procedure, it is common to feel sleepy, have impaired judgment, or feel nauseous if you eat too soon. Rest after you get home. Know the things you should not do after the procedure. Follow the diet recommended by your health care provider and drink enough fluid to keep your urine pale yellow. Get help right away if you have trouble breathing or new-onset confusion at home. This information is not intended to replace advice given to you by your health care provider. Make sure you discuss any questions you have with your health care provider. Document Revised: 08/16/2019 Document Reviewed: 03/14/2019 Elsevier Patient Education  2021 [employer].

## 2022-08-24 ENCOUNTER — Other Ambulatory Visit: Payer: Self-pay | Admitting: Urology

## 2022-08-31 DIAGNOSIS — N200 Calculus of kidney: Secondary | ICD-10-CM | POA: Diagnosis not present

## 2022-09-01 DIAGNOSIS — N132 Hydronephrosis with renal and ureteral calculous obstruction: Secondary | ICD-10-CM | POA: Diagnosis not present

## 2022-09-01 NOTE — Progress Notes (Addendum)
COVID Vaccine Completed:  Yes  Date of COVID positive in last 90 days:  No  PCP - Lorenda Ishihara, MD Cardiologist - Yates Decamp, MD  Chest x-ray - N/A EKG - 04-19-22 Epic Stress Test - 04-14-17 ECHO - 09-06-18 Epic Cardiac Cath - N/A Pacemaker/ICD device last checked: Spinal Cord Stimulator:  N/A  Bowel Prep -  N/A  Sleep Study -  N/A CPAP -   Fasting Blood Sugar -  N/A Checks Blood Sugar _____ times a day  Last dose of GLP1 agonist-  N/A GLP1 instructions:  N/A   Last dose of SGLT-2 inhibitors-  N/A SGLT-2 instructions: N/A  Blood Thinner Instructions:   N/A Aspirin Instructions: Last Dose:  Activity level:  Can go up a flight of stairs and perform activities of daily living without stopping and without symptoms of chest pain or shortness of breath.  Able to exercise without symptoms  Anesthesia review:  MVP with mod mitral regurg, HTN, CKD, hx of palpitations.  Patient is having some swelling of lower extremities, patient has advised PCP.  Patient denies shortness of breath, fever, cough and chest pain at PAT appointment  Patient verbalized understanding of instructions that were given to them at the PAT appointment. Patient was also instructed that they will need to review over the PAT instructions again at home before surgery.

## 2022-09-01 NOTE — Patient Instructions (Addendum)
SURGICAL WAITING ROOM VISITATION  Patients having surgery or a procedure may have no more than 2 support people in the waiting area - these visitors may rotate.    Children under the age of 87 must have an adult with them who is not the patient.  If the patient needs to stay at the hospital during part of their recovery, the visitor guidelines for inpatient rooms apply. Pre-op nurse will coordinate an appropriate time for 1 support person to accompany patient in pre-op.  This support person may not rotate.    Please refer to the [facility] website for the visitor guidelines for Inpatients (after your surgery is over and you are in a regular room).       Your procedure is scheduled on: 09-13-22   Report to [facility] Main Entrance    Report to admitting at 7:00 AM   Call this number if you have problems the morning of surgery [phone]   Do not eat food or drink liquids :After Midnight.            If you have questions, please contact your surgeon's office.   FOLLOW ANY ADDITIONAL PRE OP INSTRUCTIONS YOU RECEIVED FROM YOUR SURGEON'S OFFICE!!!     Oral Hygiene is also important to reduce your risk of infection.                                    Remember - BRUSH YOUR TEETH THE MORNING OF SURGERY WITH YOUR REGULAR TOOTHPASTE  DENTURES WILL BE REMOVED PRIOR TO SURGERY PLEASE DO NOT APPLY "Poly grip" OR ADHESIVES!!!   Do NOT smoke after Midnight   Take these medicines the morning of surgery with A SIP OF WATER:   Amlodipine  Bring CPAP mask and tubing day of surgery.                              You may not have any metal on your body including hair pins, jewelry, and body piercing             Do not wear make-up, lotions, powders, perfumes, or deodorant  Do not wear nail polish including gel and S&S, artificial/acrylic nails, or any other type of covering on natural nails including finger and toenails. If you have artificial nails, gel coating, etc. that needs to  be removed by a nail salon please have this removed prior to surgery or surgery may need to be canceled/ delayed if the surgeon/ anesthesia feels like they are unable to be safely monitored.   Do not shave  48 hours prior to surgery.    Do not bring valuables to the hospital. [place] IS NOT  RESPONSIBLE   FOR VALUABLES.   Contacts, glasses, dentures or bridgework may not be worn into surgery.  DO NOT BRING YOUR HOME MEDICATIONS TO THE HOSPITAL. PHARMACY WILL DISPENSE MEDICATIONS LISTED ON YOUR MEDICATION LIST TO YOU DURING YOUR ADMISSION IN THE HOSPITAL!    Patients discharged on the day of surgery will not be allowed to drive home.  Someone NEEDS to stay with you for the first 24 hours after anesthesia.   Special Instructions: Bring a copy of your healthcare power of attorney and living will documents the day of surgery if you haven't scanned them before.  Please read over the following fact sheets you were given: IF YOU HAVE QUESTIONS ABOUT YOUR PRE-OP INSTRUCTIONS PLEASE CALL [phone] [facility]   If you received a COVID test during your pre-op visit  it is requested that you wear a mask when out in public, stay away from anyone that may not be feeling well and notify your surgeon if you develop symptoms. If you test positive for Covid or have been in contact with anyone that has tested positive in the last 10 days please notify you surgeon.    [facility] - Preparing for Surgery Before surgery, you can play an important role.  Because skin is not sterile, your skin needs to be as free of germs as possible.  You can reduce the number of germs on your skin by washing with CHG (chlorahexidine gluconate) soap before surgery.  CHG is an antiseptic cleaner which kills germs and bonds with the skin to continue killing germs even after washing. Please DO NOT use if you have an allergy to CHG or antibacterial soaps.  If your skin becomes reddened/irritated stop using the CHG and inform  your nurse when you arrive at Short Stay. Do not shave (including legs and underarms) for at least 48 hours prior to the first CHG shower.  You may shave your face/neck.  Please follow these instructions carefully:  1.  Shower with CHG Soap the night before surgery and the  morning of surgery.  2.  If you choose to wash your hair, wash your hair first as usual with your normal  shampoo.  3.  After you shampoo, rinse your hair and body thoroughly to remove the shampoo.                             4.  Use CHG as you would any other liquid soap.  You can apply chg directly to the skin and wash.  Gently with a scrungie or clean washcloth.  5.  Apply the CHG Soap to your body ONLY FROM THE NECK DOWN.   Do   not use on face/ open                           Wound or open sores. Avoid contact with eyes, ears mouth and   genitals (private parts).                       Wash face,  Genitals (private parts) with your normal soap.             6.  Wash thoroughly, paying special attention to the area where your    surgery  will be performed.  7.  Thoroughly rinse your body with warm water from the neck down.  8.  DO NOT shower/wash with your normal soap after using and rinsing off the CHG Soap.                9.  Pat yourself dry with a clean towel.            10.  Wear clean pajamas.            11.  Place clean sheets on your bed the night of your first shower and do not  sleep with pets. Day of Surgery : Do not apply any lotions/deodorants the morning of surgery.  Please wear clean clothes to the hospital/surgery center.  FAILURE TO FOLLOW THESE INSTRUCTIONS MAY RESULT IN THE CANCELLATION OF YOUR SURGERY  PATIENT SIGNATURE_________________________________  NURSE SIGNATURE__________________________________  ________________________________________________________________________

## 2022-09-02 DIAGNOSIS — Z1331 Encounter for screening for depression: Secondary | ICD-10-CM | POA: Diagnosis not present

## 2022-09-02 DIAGNOSIS — I340 Nonrheumatic mitral (valve) insufficiency: Secondary | ICD-10-CM | POA: Diagnosis not present

## 2022-09-02 DIAGNOSIS — N1831 Chronic kidney disease, stage 3a: Secondary | ICD-10-CM | POA: Diagnosis not present

## 2022-09-02 DIAGNOSIS — M1610 Unilateral primary osteoarthritis, unspecified hip: Secondary | ICD-10-CM | POA: Diagnosis not present

## 2022-09-02 DIAGNOSIS — R739 Hyperglycemia, unspecified: Secondary | ICD-10-CM | POA: Diagnosis not present

## 2022-09-02 DIAGNOSIS — E785 Hyperlipidemia, unspecified: Secondary | ICD-10-CM | POA: Diagnosis not present

## 2022-09-02 DIAGNOSIS — N201 Calculus of ureter: Secondary | ICD-10-CM | POA: Diagnosis not present

## 2022-09-02 DIAGNOSIS — I10 Essential (primary) hypertension: Secondary | ICD-10-CM | POA: Diagnosis not present

## 2022-09-02 DIAGNOSIS — Z0000 Encounter for general adult medical examination without abnormal findings: Secondary | ICD-10-CM | POA: Diagnosis not present

## 2022-09-05 ENCOUNTER — Encounter (HOSPITAL_COMMUNITY)
Admission: RE | Admit: 2022-09-05 | Discharge: 2022-09-05 | Disposition: A | Discharge status: Home / Self Care | Payer: Medicare Other | Source: Ambulatory Visit | Attending: Urology | Admitting: Urology

## 2022-09-05 ENCOUNTER — Other Ambulatory Visit: Payer: Self-pay

## 2022-09-05 ENCOUNTER — Encounter (HOSPITAL_COMMUNITY): Payer: Self-pay

## 2022-09-05 VITALS — BP 127/96 | HR 75 | Temp 98.4°F | Resp 16 | Ht 66.0 in | Wt 121.4 lb

## 2022-09-05 DIAGNOSIS — I10 Essential (primary) hypertension: Secondary | ICD-10-CM | POA: Diagnosis not present

## 2022-09-05 DIAGNOSIS — Z01818 Encounter for other preprocedural examination: Secondary | ICD-10-CM | POA: Diagnosis not present

## 2022-09-05 HISTORY — DX: Nonrheumatic mitral (valve) prolapse: I34.1

## 2022-09-05 LAB — BASIC METABOLIC PANEL
Anion gap: 8 (ref 5–15)
BUN: 21 mg/dL (ref 8–23)
CO2: 26 mmol/L (ref 22–32)
Calcium: 9 mg/dL (ref 8.9–10.3)
Chloride: 107 mmol/L (ref 98–111)
Creatinine, Ser: 1.4 mg/dL — ABNORMAL HIGH (ref 0.44–1.00)
GFR, Estimated: 39 mL/min — ABNORMAL LOW (ref >60)
Glucose, Bld: 96 mg/dL (ref 70–99)
Potassium: 3.9 mmol/L (ref 3.5–5.1)
Sodium: 141 mmol/L (ref 135–145)

## 2022-09-07 NOTE — H&P (Signed)
CC/HPI: cc: Right hydronephrosis   06/10/22: 77 year old woman who is referred to nephrology for an AKI with an increased creatinine to 1.6 from her baseline subsequently underwent a renal ultrasound showing right hydronephrosis. There is concern for large calculus in the right renal pelvis. Patient denies any flank pain, UTIs, gross hematuria or history of kidney stones. She did have her hip replaced approximately 1 month ago.   06/29/22: Kelly Reyes is a 77 year old female who presents today for preoperative appointment. She is scheduled to undergo surgery with Dr. Arita Miss for a right-sided renal stone. She currently has no complaints. She denies fevers, chills and gross hematuria. She denies chest pain and shortness of breath.   07/20/2022: Kelly Reyes is a 77 year old female with the above-noted medical history. She underwent PCN placement by interventional radiology due to inability to gain access during URS. She presents to discuss next steps. He is doing well at this time and tolerating her PCN tube well. It is draining amber-colored urine.   08/31/2022: Kelly Reyes is a 77 year old female who presents today for preoperative appointment. She is doing well with her PCN and denies any flank pain or concerns. She continues to see gross hematuria which I advised is normal with stent in place. She is voiding without issue. She denies fevers and chills. She denies chest pain and shortness of breath. She has had some lower extremity edema that resolves with elevation of her lower extremities.     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  <DIV'  Tobacco Use Assessment Completed:  Used Tobacco in last 30 days?   Has never drank.  Does not drink caffeine.    REVIEW OF SYSTEMS:     GU Review Female:  Patient denies trouble starting your stream, burning /pain with urination, have to strain to urinate, being pregnant, frequent urination, hard to postpone urination, get up at night to urinate, stream starts and stops, and leakage of urine.    Gastrointestinal (Upper):  Patient denies nausea, vomiting, and indigestion/ heartburn.    Gastrointestinal (Lower):  Patient denies diarrhea and constipation.    Constitutional:  Patient denies fever, night sweats, weight loss, and fatigue.    Musculoskeletal:  Patient denies back pain and joint pain.    Neurological:  Patient denies headaches and dizziness.    Psychologic:  Patient denies depression and anxiety.    VITAL SIGNS:  08/31/2022 03:17 PM     Weight 118 lb / 53.52 kg     Height 66 in / 167.64 cm     BP 130/85 mmHg     Pulse 83 /min     Temperature 97.5 F / 36.3 C     BMI 19.0 kg/m     MULTI-SYSTEM PHYSICAL EXAMINATION:      Constitutional: Well-nourished. No physical deformities. Normally developed. Good grooming.     Respiratory: Normal breath sounds. No  labored breathing, no use of accessory muscles.      Cardiovascular: Regular rate and rhythm. No murmur, no gallop. Normal temperature, normal extremity pulses, no swelling, no varicosities.      Skin: No paleness, no jaundice, no cyanosis. No lesion, no ulcer, no rash.     Neurologic / Psychiatric: Oriented to time, oriented to place, oriented to person. No depression, no anxiety, no agitation.     Gastrointestinal: No mass, no tenderness, no rigidity, non obese abdomen.            Complexity of Data:   Source Of History:  Patient  Records Review:  Previous Doctor Records, Previous Patient Records  Urine Test Review:  Urinalysis    08/31/22  Urinalysis  Urine Appearance Cloudy   Urine Color Amber   Urine Glucose Neg mg/dL  Urine Bilirubin Neg mg/dL  Urine Ketones Neg mg/dL  Urine Specific Gravity 1.020   Urine Blood 3+ ery/uL  Urine pH 6.0   Urine Protein 3+ mg/dL  Urine Urobilinogen 0.2 mg/dL  Urine Nitrites Neg   Urine Leukocyte Esterase 2+ leu/uL  Urine WBC/hpf 20 - 40/hpf   Urine RBC/hpf 20 - 40/hpf   Urine Epithelial Cells 6 - 10/hpf   Urine Bacteria Rare (0-9/hpf)   Urine Mucous Not Present   Urine Yeast NS (Not Seen)   Urine Trichomonas Not Present   Urine Cystals Ca Oxalate   Urine Casts NS (Not Seen)   Urine Sperm Not Present    PROCEDURES:    Urinalysis w/Scope  Dipstick Dipstick Cont'd Micro  Color: Amber Bilirubin: Neg mg/dL WBC/hpf: 20 - 16/XWR  Appearance: Cloudy Ketones: Neg mg/dL RBC/hpf: 20 - 60/AVW  Specific Gravity: 1.020 Blood: 3+ ery/uL Bacteria: Rare (0-9/hpf)  pH: 6.0 Protein: 3+ mg/dL Cystals: Ca Oxalate  Glucose: Neg mg/dL Urobilinogen: 0.2 mg/dL Casts: NS (Not Seen)   Nitrites: Neg Trichomonas: Not Present   Leukocyte Esterase: 2+ leu/uL Mucous: Not Present    Epithelial Cells: 6 - 10/hpf    Yeast: NS (Not Seen)    Sperm: Not Present    ASSESSMENT:     ICD-10 Details  1 GU:  Renal calculus - N20.0 Chronic, Stable  2  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 Her questions regarding her upcoming surgery were answered to the best my ability. She will keep her procedure as scheduled on 09/13/2022 with Dr. Arita Miss.

## 2022-09-13 ENCOUNTER — Ambulatory Visit (HOSPITAL_COMMUNITY): Payer: Medicare Other | Admitting: Physician Assistant

## 2022-09-13 ENCOUNTER — Encounter (HOSPITAL_COMMUNITY): Payer: Self-pay | Admitting: Urology

## 2022-09-13 ENCOUNTER — Ambulatory Visit (HOSPITAL_COMMUNITY): Payer: Medicare Other

## 2022-09-13 ENCOUNTER — Other Ambulatory Visit: Payer: Self-pay

## 2022-09-13 ENCOUNTER — Ambulatory Visit (HOSPITAL_COMMUNITY)
Admission: RE | Admit: 2022-09-13 | Discharge: 2022-09-13 | Disposition: A | Discharge status: Home / Self Care | Payer: Medicare Other | Attending: Urology | Admitting: Urology

## 2022-09-13 ENCOUNTER — Encounter (HOSPITAL_COMMUNITY)
Admission: RE | Disposition: A | Discharge status: Home / Self Care | Payer: Self-pay | Source: Home / Self Care | Attending: Urology

## 2022-09-13 ENCOUNTER — Ambulatory Visit (HOSPITAL_BASED_OUTPATIENT_CLINIC_OR_DEPARTMENT_OTHER): Payer: Medicare Other | Admitting: Anesthesiology

## 2022-09-13 DIAGNOSIS — R310 Gross hematuria: Secondary | ICD-10-CM | POA: Insufficient documentation

## 2022-09-13 DIAGNOSIS — N179 Acute kidney failure, unspecified: Secondary | ICD-10-CM

## 2022-09-13 DIAGNOSIS — N201 Calculus of ureter: Secondary | ICD-10-CM | POA: Diagnosis not present

## 2022-09-13 DIAGNOSIS — I10 Essential (primary) hypertension: Secondary | ICD-10-CM

## 2022-09-13 DIAGNOSIS — M1990 Unspecified osteoarthritis, unspecified site: Secondary | ICD-10-CM | POA: Diagnosis not present

## 2022-09-13 DIAGNOSIS — N202 Calculus of kidney with calculus of ureter: Secondary | ICD-10-CM | POA: Diagnosis not present

## 2022-09-13 DIAGNOSIS — N200 Calculus of kidney: Secondary | ICD-10-CM | POA: Diagnosis not present

## 2022-09-13 HISTORY — PX: CYSTOSCOPY/URETEROSCOPY/HOLMIUM LASER/STENT PLACEMENT: SHX6546

## 2022-09-13 SURGERY — CYSTOSCOPY/URETEROSCOPY/HOLMIUM LASER/STENT PLACEMENT
Anesthesia: General | Laterality: Right

## 2022-09-13 MED ORDER — ONDANSETRON HCL 4 MG/2ML IJ SOLN
INTRAMUSCULAR | Status: DC | PRN
  Administered 2022-09-13: 4 mg via INTRAVENOUS

## 2022-09-13 MED ORDER — ACETAMINOPHEN 160 MG/5ML PO SOLN: 1000.0000 mg | Freq: Once | ORAL | Status: DC | PRN

## 2022-09-13 MED ORDER — CHLORHEXIDINE GLUCONATE 0.12 % MT SOLN
15.0000 mL | Freq: Once | OROMUCOSAL | Status: AC
  Administered 2022-09-13: 15 mL via OROMUCOSAL

## 2022-09-13 MED ORDER — DEXAMETHASONE SODIUM PHOSPHATE 4 MG/ML IJ SOLN
INTRAMUSCULAR | Status: DC | PRN
  Administered 2022-09-13: 4 mg via INTRAVENOUS

## 2022-09-13 MED ORDER — LACTATED RINGERS IV SOLN: INTRAVENOUS | Status: DC

## 2022-09-13 MED ORDER — ACETAMINOPHEN 10 MG/ML IV SOLN: 1000.0000 mg | Freq: Once | INTRAVENOUS | Status: DC | PRN

## 2022-09-13 MED ORDER — FENTANYL CITRATE (PF) 100 MCG/2ML IJ SOLN
INTRAMUSCULAR | Status: AC
  Filled 2022-09-13: qty 2

## 2022-09-13 MED ORDER — CEFAZOLIN SODIUM-DEXTROSE 2-4 GM/100ML-% IV SOLN
2.0000 g | INTRAVENOUS | Status: AC
  Administered 2022-09-13: 2 g via INTRAVENOUS
  Filled 2022-09-13: qty 100

## 2022-09-13 MED ORDER — LIDOCAINE HCL (CARDIAC) PF 100 MG/5ML IV SOSY
PREFILLED_SYRINGE | INTRAVENOUS | Status: DC | PRN
  Administered 2022-09-13: 60 mg via INTRAVENOUS

## 2022-09-13 MED ORDER — SODIUM CHLORIDE 0.9 % IR SOLN
Status: DC | PRN
  Administered 2022-09-13: 6000 mL via INTRAVESICAL

## 2022-09-13 MED ORDER — EPHEDRINE SULFATE (PRESSORS) 50 MG/ML IJ SOLN
INTRAMUSCULAR | Status: DC | PRN
  Administered 2022-09-13: 10 mg via INTRAVENOUS
  Administered 2022-09-13 (×4): 5 mg via INTRAVENOUS

## 2022-09-13 MED ORDER — ORAL CARE MOUTH RINSE: 15.0000 mL | Freq: Once | OROMUCOSAL | Status: AC

## 2022-09-13 MED ORDER — EPHEDRINE 5 MG/ML INJ
INTRAVENOUS | Status: AC
  Filled 2022-09-13: qty 5

## 2022-09-13 MED ORDER — ACETAMINOPHEN 500 MG PO TABS: 1000.0000 mg | ORAL_TABLET | Freq: Once | ORAL | Status: DC | PRN

## 2022-09-13 MED ORDER — PROPOFOL 10 MG/ML IV BOLUS
INTRAVENOUS | Status: DC | PRN
  Administered 2022-09-13: 90 mg via INTRAVENOUS
  Administered 2022-09-13: 20 mg via INTRAVENOUS

## 2022-09-13 MED ORDER — FENTANYL CITRATE (PF) 100 MCG/2ML IJ SOLN
INTRAMUSCULAR | Status: DC | PRN
  Administered 2022-09-13 (×2): 25 ug via INTRAVENOUS
  Administered 2022-09-13: 50 ug via INTRAVENOUS

## 2022-09-13 MED ORDER — SODIUM CHLORIDE 0.9 % IR SOLN
Status: DC | PRN
  Administered 2022-09-13: 1000 mL

## 2022-09-13 MED ORDER — FENTANYL CITRATE PF 50 MCG/ML IJ SOSY: 25.0000 ug | PREFILLED_SYRINGE | INTRAMUSCULAR | Status: DC | PRN

## 2022-09-13 MED ORDER — EPHEDRINE 5 MG/ML INJ
INTRAVENOUS | Status: AC
  Filled 2022-09-13: qty 10

## 2022-09-13 SURGICAL SUPPLY — 21 items
BAG URO CATCHER STRL LF (MISCELLANEOUS) ×1 IMPLANT
BASKET ZERO TIP NITINOL 2.4FR (BASKET) IMPLANT
BSKT STON RTRVL ZERO TP 2.4FR (BASKET) ×1
CATH URETL OPEN 5X70 (CATHETERS) ×1 IMPLANT
CLOTH BEACON ORANGE TIMEOUT ST (SAFETY) ×1 IMPLANT
DRSG TEGADERM 2-3/8X2-3/4 SM (GAUZE/BANDAGES/DRESSINGS) IMPLANT
EXTRACTOR STONE 1.7FRX115CM (UROLOGICAL SUPPLIES) IMPLANT
FIBER LASER MOSES 200 DFL (Laser) IMPLANT
GAUZE PAD ABD 8X10 STRL (GAUZE/BANDAGES/DRESSINGS) IMPLANT
GLOVE BIO SURGEON STRL SZ 6.5 (GLOVE) ×1 IMPLANT
GOWN STRL REUS W/ TWL LRG LVL3 (GOWN DISPOSABLE) ×1 IMPLANT
GOWN STRL REUS W/TWL LRG LVL3 (GOWN DISPOSABLE) ×1
GUIDEWIRE STR DUAL SENSOR (WIRE) ×1 IMPLANT
KIT TURNOVER KIT A (KITS) IMPLANT
MANIFOLD NEPTUNE II (INSTRUMENTS) ×1 IMPLANT
PACK CYSTO (CUSTOM PROCEDURE TRAY) ×1 IMPLANT
SHEATH NAVIGATOR HD 11/13X28 (SHEATH) IMPLANT
SHEATH NAVIGATOR HD 11/13X36 (SHEATH) IMPLANT
STENT URET 6FRX24 CONTOUR (STENTS) IMPLANT
TUBING CONNECTING 10 (TUBING) ×1 IMPLANT
TUBING UROLOGY SET (TUBING) ×1 IMPLANT

## 2022-09-13 NOTE — Interval H&P Note (Signed)
History and Physical Interval Note: Patient had antegrade double-J ureteral stent placed by IR.  She now returns for retrograde ureteroscopy.  She also has a nephrostomy tube that is capped which we will keep in place.  09/13/2022 8:54 AM  Kelly Reyes  has presented today for surgery, with the diagnosis of RENAL CALCULI.  The various methods of treatment have been discussed with the patient and family. After consideration of risks, benefits and other options for treatment, the patient has consented to  Procedure(s): CYSTOSCOPY RIGHT URETEROSCOPY, HOLMIUM LASER LITHOTRIPSY, RIGHT URETERAL STENT EXCHANGE (Right) as a surgical intervention.  The patient's history has been reviewed, patient examined, no change in status, stable for surgery.  I have reviewed the patient's chart and labs.  Questions were answered to the patient's satisfaction.     Laura Radilla D Briane Birden

## 2022-09-13 NOTE — Discharge Instructions (Addendum)
DISCHARGE INSTRUCTIONS FOR KIDNEY STONE/URETERAL STENT   MEDICATIONS:  1. Resume all your other meds from home  2. AZO over the counter can help with the burning/stinging when you urinate. 3. Tramadol is for moderate/severe pain, otherwise taking up to 1000 mg every 6 hours of plainTylenol will help treat your pain.      ACTIVITY:  1. No strenuous activity x 1week  2. No driving while on narcotic pain medications  3. Drink plenty of water  4. Continue to walk at home - you can still get blood clots when you are at home, so keep active, but don't over do it.  5. May return to work/school tomorrow or when you feel ready   BATHING:  1. You can shower and we recommend daily showers  2. You have a string coming from your urethra: The stent string is attached to your ureteral stent. Do not pull on this.   SIGNS/SYMPTOMS TO CALL:  Please call us if you have a fever greater than 101.5, uncontrolled nausea/vomiting, uncontrolled pain, dizziness, unable to urinate, bloody urine, chest pain, shortness of breath, leg swelling, leg pain, redness around wound, drainage from wound, or any other concerns or questions.   You can reach [place] at [phone].   FOLLOW-UP:  1. You will be schedule for an xray in about 2 weeks and discuss stent removal

## 2022-09-13 NOTE — Op Note (Signed)
Preoperative diagnosis: right renal calculus  Postoperative diagnosis: right renal calculus  Procedure:  Cystoscopy right ureteroscopy, laser lithotripsy, basket stone extraction right 48F x 24cm ureteral stent exchange  Removal of right percutaneous nephrostomy tube  Surgeon: Kasandra Knudsen, MD  Anesthesia: General  Complications: None  Intraoperative findings:  Normal urethra Bilateral orthotropic ureteral orifices Spot fluoroscopy demonstrated right PCN tube, right JJ ureteral stent, and 2 cm UPJ calculus Bladder mucosa normal without masses   EBL: Minimal  Specimens: right renal calculus  Disposition of specimens: [facility] for stone analysis  Indication: Kelly Reyes is a 77 y.o.   patient with a 2cm right UPJ calculus who previously underwent attempted ureteroscopy however wire could not traverse past stone. She ultimately underwent right PCN placement followed by antegrade JJ ureteral stent placement by IR.  She now returns for definitive management of stone.  After reviewing the management options for treatment, the patient elected to proceed with the above surgical procedure(s). We have discussed the potential benefits and risks of the procedure, side effects of the proposed treatment, the likelihood of the patient achieving the goals of the procedure, and any potential problems that might occur during the procedure or recuperation. Informed consent has been obtained.   Description of procedure:  The patient was taken to the operating room and general anesthesia was induced.  The patient was placed in the dorsal lithotomy position, prepped and draped in the usual sterile fashion, and preoperative antibiotics were administered. A preoperative time-out was performed.   Cystourethroscopy was performed.  The patient's urethra was examined and was normal. The bladder was then systematically examined in its entirety. There was no evidence for any  bladder tumors, stones, or other mucosal pathology.    Attention then turned to the right ureteral orifice and the existing right ureteral stent was grasped with a grasper and brought to the urethral meatus.  A 0.38 sensor wire was then advanced through the ureteral stent and up to the renal pelvis with fluoroscopic guidance.  The stent was removed.  It was examined and deemed to be intact.  Next a second sensor wire was placed alongside the first sensor wire with fluoroscopic guidance.  1 wire was secured as a safety wire.  A ureteral access sheath was placed over the second wire and advanced to the proximal ureter with fluoroscopy fluoroscopy.  The inner sheath and wire were removed.  Flexible ureteroscopy then took place.  The calculus was easily identified in the renal pelvis/UPJ. The stone was then fragmented with the 242 micron holmium laser fiber.  The right disease nephrostomy tube was easily visualized during flexible ureteroscopy and was removed intraoperatively to aid in visualization.  Stone fragments were then removed with a 0 tip basket.  Due to the size of the stone and number of stone fragments not all stone fragments could be removed.  Passing continued until visualization was poor due to the amount of dust present. The ureteral access sheath was then removed and using with the ureteroscope taking care examine the ureter on the way out.  There were no stone fragments or trauma noted to the ureter.  The wire was then backloaded through the cystoscope and a ureteral stent was advance over the wire using Seldinger technique.  The stent was positioned appropriately under fluoroscopic and cystoscopic guidance.  The wire was then removed with an adequate stent curl noted in the renal pelvis as well as in the bladder.  The bladder was then emptied and  the procedure ended.  The patient appeared to tolerate the procedure well and without complications.  The patient was able to be awakened and  transferred to the recovery unit in satisfactory condition.   Disposition: No tether was left on the stent.  I like to obtain a KUB in 2 weeks prior to considering removing stent.

## 2022-09-13 NOTE — OR Nursing (Signed)
Stone taken by Dr. Arita Miss and some provided to patient.

## 2022-09-13 NOTE — Transfer of Care (Signed)
Immediate Anesthesia Transfer of Care Note  Patient: Kelly Reyes  Procedure(s) Performed: Procedure(s): CYSTOSCOPY RIGHT URETEROSCOPY, HOLMIUM LASER LITHOTRIPSY, RIGHT URETERAL STENT EXCHANGE (Right)  Patient Location: PACU  Anesthesia Type:General  Level of Consciousness:  sedated, patient cooperative and responds to stimulation  Airway & Oxygen Therapy:Patient Spontanous Breathing and Patient connected to face mask oxgen  Post-op Assessment:  Report given to PACU RN and Post -op Vital signs reviewed and stable  Post vital signs:  Reviewed and stable  Last Vitals:  Vitals:   09/13/22 0755  BP: 137/82  Pulse: 74  Resp: 18  Temp: 36.7 C  SpO2: 98%    Complications: No apparent anesthesia complications

## 2022-09-13 NOTE — Anesthesia Preprocedure Evaluation (Signed)
Anesthesia Evaluation  Patient identified by MRN, date of birth, ID band Patient awake    Reviewed: Allergy & Precautions, NPO status , Patient's Chart, lab work & pertinent test results  History of Anesthesia Complications Negative for: history of anesthetic complications  Airway Mallampati: I  TM Distance: >3 FB Neck ROM: Full    Dental  (+) Edentulous Upper, Missing, Dental Advisory Given,    Pulmonary neg pulmonary ROS   breath sounds clear to auscultation       Cardiovascular hypertension, Pt. on medications (-) angina (-) Past MI and (-) CHF  Rhythm:Regular     Neuro/Psych negative neurological ROS  negative psych ROS   GI/Hepatic negative GI ROS, Neg liver ROS,,,  Endo/Other  negative endocrine ROS    Renal/GU ARFRenal diseaseLab Results      Component                Value               Date                      NA                       141                 09/05/2022                K                        3.9                 09/05/2022                CO2                      26                  09/05/2022                GLUCOSE                  96                  09/05/2022                BUN                      21                  09/05/2022                CREATININE               1.40 (H)            09/05/2022                CALCIUM                  9.0                 09/05/2022                GFRNONAA                 39 (L)              09/05/2022  Musculoskeletal  (+) Arthritis ,    Abdominal   Peds  Hematology negative hematology ROS (+)   Anesthesia Other Findings   Reproductive/Obstetrics                             Anesthesia Physical Anesthesia Plan  ASA: 3  Anesthesia Plan: General   Post-op Pain Management: Minimal or no pain anticipated   Induction: Intravenous  PONV Risk Score and Plan: 3 and Ondansetron and Dexamethasone  Airway  Management Planned: LMA  Additional Equipment: None  Intra-op Plan:   Post-operative Plan: Extubation in OR  Informed Consent: I have reviewed the patients History and Physical, chart, labs and discussed the procedure including the risks, benefits and alternatives for the proposed anesthesia with the patient or authorized representative who has indicated his/her understanding and acceptance.     Dental advisory given  Plan Discussed with: CRNA  Anesthesia Plan Comments:        Anesthesia Quick Evaluation

## 2022-09-13 NOTE — Anesthesia Procedure Notes (Signed)
Procedure Name: LMA Insertion Date/Time: 09/13/2022 9:41 AM  Performed by: Theodosia Quay, CRNAPre-anesthesia Checklist: Patient identified, Emergency Drugs available, Suction available, Patient being monitored and Timeout performed Patient Re-evaluated:Patient Re-evaluated prior to induction Oxygen Delivery Method: Circle system utilized Preoxygenation: Pre-oxygenation with 100% oxygen Induction Type: IV induction Ventilation: Mask ventilation without difficulty LMA: LMA inserted and LMA with gastric port inserted LMA Size: 4.0 Tube size: 4.0 mm Number of attempts: 1 Placement Confirmation: positive ETCO2 and breath sounds checked- equal and bilateral Tube secured with: Tape Dental Injury: Teeth and Oropharynx as per pre-operative assessment  Comments: AT LMA

## 2022-09-14 ENCOUNTER — Encounter (HOSPITAL_COMMUNITY): Payer: Self-pay | Admitting: Urology

## 2022-09-17 NOTE — Anesthesia Postprocedure Evaluation (Signed)
Anesthesia Post Note  Patient: [name]  Procedure(s) Performed: CYSTOSCOPY RIGHT URETEROSCOPY, HOLMIUM LASER LITHOTRIPSY, RIGHT URETERAL STENT EXCHANGE (Right)     Patient location during evaluation: PACU Anesthesia Type: General Level of consciousness: awake and alert Pain management: pain level controlled Vital Signs Assessment: post-procedure vital signs reviewed and stable Respiratory status: spontaneous breathing, nonlabored ventilation, respiratory function stable and patient connected to nasal cannula oxygen Cardiovascular status: blood pressure returned to baseline and stable Postop Assessment: no apparent nausea or vomiting Anesthetic complications: no   No notable events documented.  Last Vitals:  Vitals:   09/13/22 1130 09/13/22 1145  BP: 117/76 123/77  Pulse: 62 65  Resp: 13   Temp: (!) 36.4 C 36.8 C  SpO2: 97% 99%    Last Pain:  Vitals:   09/13/22 1145  TempSrc:   PainSc: 0-No pain                 Chidinma Clites

## 2022-09-20 DIAGNOSIS — R8271 Bacteriuria: Secondary | ICD-10-CM | POA: Diagnosis not present

## 2022-09-20 DIAGNOSIS — N132 Hydronephrosis with renal and ureteral calculous obstruction: Secondary | ICD-10-CM | POA: Diagnosis not present

## 2022-09-23 ENCOUNTER — Other Ambulatory Visit: Payer: Self-pay | Admitting: Urology

## 2022-09-27 ENCOUNTER — Encounter (HOSPITAL_COMMUNITY)
Admission: RE | Admit: 2022-09-27 | Discharge: 2022-09-27 | Disposition: A | Discharge status: Home / Self Care | Payer: Medicare Other | Source: Ambulatory Visit | Attending: Internal Medicine | Admitting: Internal Medicine

## 2022-09-27 ENCOUNTER — Encounter (HOSPITAL_COMMUNITY): Payer: Self-pay

## 2022-09-27 ENCOUNTER — Other Ambulatory Visit: Payer: Self-pay

## 2022-09-27 HISTORY — DX: Other complications of anesthesia, initial encounter: T88.59XA

## 2022-09-27 NOTE — Progress Notes (Addendum)
Anesthesia Review:  PCP: Lorenda Ishihara  Cardiologist : DR Jacinto Halim  LOV 04/29/22  Chest x-ray : EKG : 04/19/22  Echo : 2020  Stress test: Cardiac Cath :  Activity level: can do a flight of stairs without difficulty  Sleep Study/ CPAP : none  Fasting Blood Sugar :      / Checks Blood Sugar -- times a day:   Blood Thinner/ Instructions /Last Dose: ASA / Instructions/ Last Dose : \   09/13/22- Cystoscopy- pt states with  this procedure had tremors and shaking after .  This has never happened before per pt at time of preop phone call on 09/27/22.    Med hx and instrucitons completed via preop phone call on 09/27/22.

## 2022-09-27 NOTE — Patient Instructions (Addendum)
SURGICAL WAITING ROOM VISITATION  Patients having surgery or a procedure may have no more than 2 support people in the waiting area - these visitors may rotate.    Children under the age of 40 must have an adult with them who is not the patient.  Due to an increase in RSV and influenza rates and associated hospitalizations, children ages 66 and under may not visit patients in [facility] hospitals.  If the patient needs to stay at the hospital during part of their recovery, the visitor guidelines for inpatient rooms apply. Pre-op nurse will coordinate an appropriate time for 1 support person to accompany patient in pre-op.  This support person may not rotate.    Please refer to the [facility] website for the visitor guidelines for Inpatients (after your surgery is over and you are in a regular room).       Your procedure is scheduled on:   10/04/2022    Report to [facility] Main Entrance    Report to admitting at   1215 pm    Call this number if you have problems the morning of surgery [phone]   Do not eat food or dirnk liquids :After Midnight.                 If you have questions, please contact your surgeon's office.       Oral Hygiene is also important to reduce your risk of infection.                                    Remember - BRUSH YOUR TEETH THE MORNING OF SURGERY WITH YOUR REGULAR TOOTHPASTE  DENTURES WILL BE REMOVED PRIOR TO SURGERY PLEASE DO NOT APPLY "Poly grip" OR ADHESIVES!!!   Do NOT smoke after Midnight   Take these medicines the morning of surgery with A SIP OF WATER:  amlodipine   DO NOT TAKE ANY ORAL DIABETIC MEDICATIONS DAY OF YOUR SURGERY  Bring CPAP mask and tubing day of surgery.                              You may not have any metal on your body including hair pins, jewelry, and body piercing             Do not wear make-up, lotions, powders, perfumes/cologne, or deodorant  Do not wear nail polish including gel and S&S,  artificial/acrylic nails, or any other type of covering on natural nails including finger and toenails. If you have artificial nails, gel coating, etc. that needs to be removed by a nail salon please have this removed prior to surgery or surgery may need to be canceled/ delayed if the surgeon/ anesthesia feels like they are unable to be safely monitored.   Do not shave  48 hours prior to surgery.               Men may shave face and neck.   Do not bring valuables to the hospital.  IS NOT             RESPONSIBLE   FOR VALUABLES.   Contacts, glasses, dentures or bridgework may not be worn into surgery.   Bring small overnight bag day of surgery.   DO NOT BRING YOUR HOME MEDICATIONS TO THE HOSPITAL. PHARMACY WILL DISPENSE MEDICATIONS LISTED ON YOUR MEDICATION LIST TO YOU  DURING YOUR ADMISSION IN THE HOSPITAL!    Patients discharged on the day of surgery will not be allowed to drive home.  Someone NEEDS to stay with you for the first 24 hours after anesthesia.   Special Instructions: Bring a copy of your healthcare power of attorney and living will documents the day of surgery if you haven't scanned them before.              Please read over the following fact sheets you were given: IF YOU HAVE QUESTIONS ABOUT YOUR PRE-OP INSTRUCTIONS PLEASE CALL [phone]   If you received a COVID test during your pre-op visit  it is requested that you wear a mask when out in public, stay away from anyone that may not be feeling well and notify your surgeon if you develop symptoms. If you test positive for Covid or have been in contact with anyone that has tested positive in the last 10 days please notify you surgeon.    [facility] - Preparing for Surgery Before surgery, you can play an important role.  Because skin is not sterile, your skin needs to be as free of germs as possible.  You can reduce the number of germs on your skin by washing with CHG (chlorahexidine gluconate) soap before  surgery.  CHG is an antiseptic cleaner which kills germs and bonds with the skin to continue killing germs even after washing. Please DO NOT use if you have an allergy to CHG or antibacterial soaps.  If your skin becomes reddened/irritated stop using the CHG and inform your nurse when you arrive at Short Stay. Do not shave (including legs and underarms) for at least 48 hours prior to the first CHG shower.  You may shave your face/neck. Please follow these instructions carefully:  1.  Shower with CHG Soap the night before surgery and the  morning of Surgery.  2.  If you choose to wash your hair, wash your hair first as usual with your  normal  shampoo.  3.  After you shampoo, rinse your hair and body thoroughly to remove the  shampoo.                           4.  Use CHG as you would any other liquid soap.  You can apply chg directly  to the skin and wash                       Gently with a scrungie or clean washcloth.  5.  Apply the CHG Soap to your body ONLY FROM THE NECK DOWN.   Do not use on face/ open                           Wound or open sores. Avoid contact with eyes, ears mouth and genitals (private parts).                       Wash face,  Genitals (private parts) with your normal soap.             6.  Wash thoroughly, paying special attention to the area where your surgery  will be performed.  7.  Thoroughly rinse your body with warm water from the neck down.  8.  DO NOT shower/wash with your normal soap after using and rinsing off  the CHG Soap.  9.  Pat yourself dry with a clean towel.            10.  Wear clean pajamas.            11.  Place clean sheets on your bed the night of your first shower and do not  sleep with pets. Day of Surgery : Do not apply any lotions/deodorants the morning of surgery.  Please wear clean clothes to the hospital/surgery center.  FAILURE TO FOLLOW THESE INSTRUCTIONS MAY RESULT IN THE CANCELLATION OF YOUR SURGERY PATIENT  SIGNATURE_________________________________  NURSE SIGNATURE__________________________________  ________________________________________________________________________

## 2022-10-04 ENCOUNTER — Ambulatory Visit (HOSPITAL_COMMUNITY)
Admission: RE | Admit: 2022-10-04 | Discharge: 2022-10-04 | Disposition: A | Discharge status: Home / Self Care | Payer: Medicare Other | Attending: Urology | Admitting: Urology

## 2022-10-04 ENCOUNTER — Encounter (HOSPITAL_COMMUNITY): Payer: Self-pay | Admitting: Urology

## 2022-10-04 ENCOUNTER — Ambulatory Visit (HOSPITAL_COMMUNITY): Payer: Medicare Other

## 2022-10-04 ENCOUNTER — Ambulatory Visit (HOSPITAL_BASED_OUTPATIENT_CLINIC_OR_DEPARTMENT_OTHER): Payer: Medicare Other | Admitting: Anesthesiology

## 2022-10-04 ENCOUNTER — Encounter (HOSPITAL_COMMUNITY)
Admission: RE | Disposition: A | Discharge status: Home / Self Care | Payer: Self-pay | Source: Home / Self Care | Attending: Urology

## 2022-10-04 ENCOUNTER — Ambulatory Visit (HOSPITAL_COMMUNITY): Payer: Medicare Other | Admitting: Physician Assistant

## 2022-10-04 ENCOUNTER — Other Ambulatory Visit: Payer: Self-pay

## 2022-10-04 DIAGNOSIS — N201 Calculus of ureter: Secondary | ICD-10-CM | POA: Diagnosis not present

## 2022-10-04 DIAGNOSIS — Z841 Family history of disorders of kidney and ureter: Secondary | ICD-10-CM | POA: Insufficient documentation

## 2022-10-04 DIAGNOSIS — N189 Chronic kidney disease, unspecified: Secondary | ICD-10-CM | POA: Diagnosis not present

## 2022-10-04 DIAGNOSIS — I10 Essential (primary) hypertension: Secondary | ICD-10-CM | POA: Diagnosis not present

## 2022-10-04 DIAGNOSIS — E785 Hyperlipidemia, unspecified: Secondary | ICD-10-CM | POA: Insufficient documentation

## 2022-10-04 DIAGNOSIS — N200 Calculus of kidney: Secondary | ICD-10-CM | POA: Diagnosis not present

## 2022-10-04 DIAGNOSIS — I129 Hypertensive chronic kidney disease with stage 1 through stage 4 chronic kidney disease, or unspecified chronic kidney disease: Secondary | ICD-10-CM | POA: Diagnosis not present

## 2022-10-04 HISTORY — PX: CYSTOSCOPY/URETEROSCOPY/HOLMIUM LASER/STENT PLACEMENT: SHX6546

## 2022-10-04 SURGERY — CYSTOSCOPY/URETEROSCOPY/HOLMIUM LASER/STENT PLACEMENT
Anesthesia: General | Site: Bladder | Laterality: Right

## 2022-10-04 MED ORDER — PROPOFOL 10 MG/ML IV BOLUS
INTRAVENOUS | Status: DC | PRN
  Administered 2022-10-04: 30 mg via INTRAVENOUS
  Administered 2022-10-04: 150 mg via INTRAVENOUS

## 2022-10-04 MED ORDER — FENTANYL CITRATE PF 50 MCG/ML IJ SOSY: 25.0000 ug | PREFILLED_SYRINGE | INTRAMUSCULAR | Status: DC | PRN

## 2022-10-04 MED ORDER — PHENYLEPHRINE 80 MCG/ML (10ML) SYRINGE FOR IV PUSH (FOR BLOOD PRESSURE SUPPORT)
PREFILLED_SYRINGE | INTRAVENOUS | Status: AC
  Filled 2022-10-04: qty 10

## 2022-10-04 MED ORDER — LIDOCAINE HCL (PF) 2 % IJ SOLN
INTRAMUSCULAR | Status: AC
  Filled 2022-10-04: qty 5

## 2022-10-04 MED ORDER — CEFAZOLIN SODIUM-DEXTROSE 2-4 GM/100ML-% IV SOLN
2.0000 g | INTRAVENOUS | Status: AC
  Administered 2022-10-04: 2 g via INTRAVENOUS
  Filled 2022-10-04: qty 100

## 2022-10-04 MED ORDER — DEXAMETHASONE SODIUM PHOSPHATE 4 MG/ML IJ SOLN
INTRAMUSCULAR | Status: DC | PRN
  Administered 2022-10-04: 5 mg via INTRAVENOUS

## 2022-10-04 MED ORDER — FENTANYL CITRATE (PF) 100 MCG/2ML IJ SOLN
INTRAMUSCULAR | Status: AC
  Filled 2022-10-04: qty 2

## 2022-10-04 MED ORDER — TRAMADOL HCL 50 MG PO TABS: 50.0000 mg | ORAL_TABLET | Freq: Four times a day (QID) | ORAL | 0 refills | Status: DC | PRN

## 2022-10-04 MED ORDER — ONDANSETRON HCL 4 MG/2ML IJ SOLN
INTRAMUSCULAR | Status: AC
  Filled 2022-10-04: qty 2

## 2022-10-04 MED ORDER — PHENYLEPHRINE 80 MCG/ML (10ML) SYRINGE FOR IV PUSH (FOR BLOOD PRESSURE SUPPORT)
PREFILLED_SYRINGE | INTRAVENOUS | Status: DC | PRN
  Administered 2022-10-04: 80 ug via INTRAVENOUS

## 2022-10-04 MED ORDER — TAMSULOSIN HCL 0.4 MG PO CAPS: 0.4000 mg | ORAL_CAPSULE | Freq: Every day | ORAL | 0 refills | Status: DC

## 2022-10-04 MED ORDER — LIDOCAINE HCL (CARDIAC) PF 100 MG/5ML IV SOSY
PREFILLED_SYRINGE | INTRAVENOUS | Status: DC | PRN
  Administered 2022-10-04: 60 mg via INTRATRACHEAL

## 2022-10-04 MED ORDER — PROPOFOL 10 MG/ML IV BOLUS
INTRAVENOUS | Status: AC
  Filled 2022-10-04: qty 20

## 2022-10-04 MED ORDER — DEXAMETHASONE SODIUM PHOSPHATE 10 MG/ML IJ SOLN
INTRAMUSCULAR | Status: AC
  Filled 2022-10-04: qty 1

## 2022-10-04 MED ORDER — SODIUM CHLORIDE 0.9 % IR SOLN
Status: DC | PRN
  Administered 2022-10-04: 3000 mL via INTRAVESICAL

## 2022-10-04 MED ORDER — CHLORHEXIDINE GLUCONATE 0.12 % MT SOLN
15.0000 mL | Freq: Once | OROMUCOSAL | Status: AC
  Administered 2022-10-04: 15 mL via OROMUCOSAL

## 2022-10-04 MED ORDER — FENTANYL CITRATE (PF) 100 MCG/2ML IJ SOLN
INTRAMUSCULAR | Status: DC | PRN
  Administered 2022-10-04 (×3): 25 ug via INTRAVENOUS

## 2022-10-04 MED ORDER — LACTATED RINGERS IV SOLN: INTRAVENOUS | Status: DC

## 2022-10-04 MED ORDER — ONDANSETRON HCL 4 MG/2ML IJ SOLN
INTRAMUSCULAR | Status: DC | PRN
  Administered 2022-10-04: 4 mg via INTRAVENOUS

## 2022-10-04 MED ORDER — CEPHALEXIN 500 MG PO CAPS: 500.0000 mg | ORAL_CAPSULE | Freq: Once | ORAL | 0 refills | Status: AC

## 2022-10-04 MED ORDER — PROPOFOL 500 MG/50ML IV EMUL
INTRAVENOUS | Status: DC | PRN
  Administered 2022-10-04: 125 ug/kg/min via INTRAVENOUS

## 2022-10-04 MED ORDER — 0.9 % SODIUM CHLORIDE (POUR BTL) OPTIME
TOPICAL | Status: DC | PRN
  Administered 2022-10-04: 500 mL

## 2022-10-04 MED ORDER — ACETAMINOPHEN 500 MG PO TABS: 1000.0000 mg | ORAL_TABLET | Freq: Once | ORAL | Status: DC

## 2022-10-04 SURGICAL SUPPLY — 24 items
BAG URO CATCHER STRL LF (MISCELLANEOUS) ×1 IMPLANT
BASKET ZERO TIP NITINOL 2.4FR (BASKET) IMPLANT
BSKT STON RTRVL ZERO TP 2.4FR (BASKET) ×1
CATH URETL OPEN 5X70 (CATHETERS) ×1 IMPLANT
CLOTH BEACON ORANGE TIMEOUT ST (SAFETY) ×1 IMPLANT
DRSG TEGADERM 2-3/8X2-3/4 SM (GAUZE/BANDAGES/DRESSINGS) IMPLANT
EXTRACTOR STONE 1.7FRX115CM (UROLOGICAL SUPPLIES) IMPLANT
FIBER LASER MOSES 200 DFL (Laser) IMPLANT
FIBER LASER MOSES 365 DFL (Laser) IMPLANT
GLOVE BIO SURGEON STRL SZ 6.5 (GLOVE) ×1 IMPLANT
GOWN STRL REUS W/ TWL LRG LVL3 (GOWN DISPOSABLE) ×1 IMPLANT
GOWN STRL REUS W/TWL LRG LVL3 (GOWN DISPOSABLE) ×1
GUIDEWIRE STR DUAL SENSOR (WIRE) ×1 IMPLANT
KIT TURNOVER KIT A (KITS) IMPLANT
LASER FIB FLEXIVA PULSE ID 365 (Laser) IMPLANT
MANIFOLD NEPTUNE II (INSTRUMENTS) ×1 IMPLANT
PACK CYSTO (CUSTOM PROCEDURE TRAY) ×1 IMPLANT
SHEATH NAVIGATOR HD 11/13X28 (SHEATH) IMPLANT
SHEATH NAVIGATOR HD 11/13X36 (SHEATH) IMPLANT
STENT URET 6FRX24 CONTOUR (STENTS) IMPLANT
TRACTIP FLEXIVA PULS ID 200XHI (Laser) IMPLANT
TRACTIP FLEXIVA PULSE ID 200 (Laser) ×1
TUBING CONNECTING 10 (TUBING) ×1 IMPLANT
TUBING UROLOGY SET (TUBING) ×1 IMPLANT

## 2022-10-04 NOTE — Anesthesia Procedure Notes (Signed)
Procedure Name: LMA Insertion Date/Time: 10/04/2022 12:55 PM  Performed by: Florene Route, CRNAPatient Re-evaluated:Patient Re-evaluated prior to induction Oxygen Delivery Method: Circle system utilized Preoxygenation: Pre-oxygenation with 100% oxygen LMA: LMA inserted LMA Size: 4.0 Number of attempts: 1 Placement Confirmation: positive ETCO2 and breath sounds checked- equal and bilateral Tube secured with: Tape Dental Injury: Teeth and Oropharynx as per pre-operative assessment

## 2022-10-04 NOTE — H&P (Signed)
CC/HPI: cc: Right hydronephrosis   06/10/22: 77 year old woman who is referred to nephrology for an AKI with an increased creatinine to 1.6 from her baseline subsequently underwent a renal ultrasound showing right hydronephrosis. There is concern for large calculus in the right renal pelvis. Patient denies any flank pain, UTIs, gross hematuria or history of kidney stones. She did have her hip replaced approximately 1 month ago.   06/29/22: Kelly Reyes is a 77 year old female who presents today for preoperative appointment. She is scheduled to undergo surgery with Dr. Arita Miss for a right-sided renal stone. She currently has no complaints. She denies fevers, chills and gross hematuria. She denies chest pain and shortness of breath.   07/20/2022: Kelly Reyes is a 77 year old female with the above-noted medical history. She underwent PCN placement by interventional radiology due to inability to gain access during URS. She presents to discuss next steps. He is doing well at this time and tolerating her PCN tube well. It is draining amber-colored urine.   08/31/2022: Kelly Reyes is a 77 year old female who presents today for preoperative appointment. She is doing well with her PCN and denies any flank pain or concerns. She continues to see gross hematuria which I advised is normal with stent in place. She is voiding without issue. She denies fevers and chills. She denies chest pain and shortness of breath. She has had some lower extremity edema that resolves with elevation of her lower extremities.   09/20/2022: 77 year old female who presents today for follow-up after she underwent right-sided ureteroscopy for 2 cm stone. She has been tolerating the stent well. Her PCN tube was removed during her operation. She denies fevers, chills. She denies flank pain. Per operative note, a majority of the stone was fragmented although visualization became difficult due to dust fragments. A KUB was indicated prior to removing stent.      ALLERGIES: Codeine - Nausea Donepezil HCl TABS - Nausea    MEDICATIONS: Amlodipine Besylate 5 mg tablet  Cephalexin  Ezetimibe 10 mg tablet  Micardis Hct 40 mg-12.5 mg tablet  Rosuvastatin Calcium 10 mg tablet  Telmisartan-Hydrochlorothiazid 40 mg-12.5 mg tablet     GU PSH: Cysto Dilate Stricture (M or F) - 07/12/2022 Ureteroscopic laser litho, Right - 09/13/2022       PSH Notes: Colon polyp removal, D&C of uterus, right breast biopsy, hip surgery   NON-GU PSH: Bilateral Tubal Ligation Hip Replacement, 05/11/2022     GU PMH: Renal calculus - 08/31/2022, - 07/20/2022, - 06/29/2022, - 06/10/2022 Ureteral obstruction secondary to calculous - 08/31/2022, - 07/20/2022, - 06/10/2022      PMH Notes: Heart murmur, AKI   NON-GU PMH: Arthritis Hypercholesterolemia Hypertension Vitamin D deficiency, unspecified    FAMILY HISTORY: 2 daughters - Daughter cerebrovascular accident (CVA) - Mother Diabetes - Father, Brother Heart Attack - Father Heart Disease - Father nephrolithiasis - Daughter stroke - Mother   SOCIAL HISTORY: Marital Status: Married Preferred Language: English; Ethnicity: Not Hispanic Or Latino; Race: Black or African American Current Smoking Status: Patient has never smoked.   Tobacco Use Assessment Completed: Used Tobacco in last 30 days? Has never drank.  Does not drink caffeine.    REVIEW OF SYSTEMS:    GU Review Female:   Patient denies frequent urination, trouble starting your stream, hard to postpone urination, leakage of urine, burning /pain with urination, have to strain to urinate, stream starts and stops, get up at night to urinate, and being pregnant.  Gastrointestinal (Upper):   Patient denies nausea, vomiting, and indigestion/  heartburn.  Gastrointestinal (Lower):   Patient denies diarrhea and constipation.  Constitutional:   Patient denies fever, night sweats, weight loss, and fatigue.  Skin:   Patient denies skin rash/ lesion and itching.  Eyes:    Patient denies blurred vision and double vision.  Musculoskeletal:   Patient denies back pain and joint pain.  Neurological:   Patient denies headaches and dizziness.  Psychologic:   Patient denies depression and anxiety.   VITAL SIGNS:      09/20/2022 01:52 PM  Weight 122 lb / 55.34 kg  Height 66 in / 167.64 cm  BP 119/79 mmHg  Pulse 86 /min  Temperature 97.1 F / 36.1 C  BMI 19.7 kg/m   GU PHYSICAL EXAMINATION:      Notes: Right neph tube site is well-healed   MULTI-SYSTEM PHYSICAL EXAMINATION:    Constitutional: Well-nourished. No physical deformities. Normally developed. Good grooming.  Respiratory: No labored breathing, no use of accessory muscles.   Cardiovascular: Normal temperature, normal extremity pulses, no swelling, no varicosities.  Skin: No paleness, no jaundice, no cyanosis. No lesion, no ulcer, no rash.  Neurologic / Psychiatric: Oriented to time, oriented to place, oriented to person. No depression, no anxiety, no agitation.  Gastrointestinal: No mass, no tenderness, no rigidity, non obese abdomen.     Complexity of Data:  Source Of History:  Patient  Records Review:   Previous Hospital Records, Previous Patient Records  Urine Test Review:   Urinalysis  X-Ray Review: KUB: Reviewed Films. Reviewed Report. Discussed With Patient.     09/20/22  Urinalysis  Urine Appearance Cloudy   Urine Color Amber   Urine Glucose Neg mg/dL  Urine Bilirubin Neg mg/dL  Urine Ketones Neg mg/dL  Urine Specific Gravity 1.025   Urine Blood 3+ ery/uL  Urine pH 6.5   Urine Protein 3+ mg/dL  Urine Urobilinogen 0.2 mg/dL  Urine Nitrites Neg   Urine Leukocyte Esterase 3+ leu/uL  Urine WBC/hpf 6 - 10/hpf   Urine RBC/hpf >60/hpf   Urine Epithelial Cells 0 - 5/hpf   Urine Bacteria Mod (26-50/hpf)   Urine Mucous Present   Urine Yeast NS (Not Seen)   Urine Trichomonas Not Present   Urine Cystals NS (Not Seen)   Urine Casts NS (Not Seen)   Urine Sperm Not Present     PROCEDURES:         KUB - 16109  A single view of the abdomen is obtained. Right-sided ureteral stent is in good position. Within the right renal shadow there are multiple small opacities that may represent small dust fragments from dusted stone as well as a larger opacity measuring approximately 1 cm in the right lower pole. There are no appreciable opacities along the ureteral stent.      . Patient confirmed No Neulasta OnPro Device.           Urinalysis w/Scope Dipstick Dipstick Cont'd Micro  Color: Amber Bilirubin: Neg mg/dL WBC/hpf: 6 - 60/AVW  Appearance: Cloudy Ketones: Neg mg/dL RBC/hpf: >09/WJX  Specific Gravity: 1.025 Blood: 3+ ery/uL Bacteria: Mod (26-50/hpf)  pH: 6.5 Protein: 3+ mg/dL Cystals: NS (Not Seen)  Glucose: Neg mg/dL Urobilinogen: 0.2 mg/dL Casts: NS (Not Seen)    Nitrites: Neg Trichomonas: Not Present    Leukocyte Esterase: 3+ leu/uL Mucous: Present      Epithelial Cells: 0 - 5/hpf      Yeast: NS (Not Seen)      Sperm: Not Present    ASSESSMENT:  ICD-10 Details  1 GU:   Ureteral obstruction secondary to calculous - N13.2 Right, Acute, Systemic Symptoms, Improving   PLAN:           Orders Labs CULTURE, URINE  X-Rays: KUB          Schedule         Document Letter(s):  Created for Patient: Clinical Summary         Notes:   Urine was sent for culture today and I plan on treating according to culture results. KUB does show some residual stone burden within the right renal shadow. I discussed that her urologist, Dr. Arita Miss may wish to take a second look. I am going to follow-up with Dr. Arita Miss regarding this finding and see how she would like to proceed. Should she feel the stent is able to come out, I am happy to get her in for stent removal as soon as possible. Otherwise, I will follow-up with her regarding a second look procedure.

## 2022-10-04 NOTE — Discharge Instructions (Addendum)
DISCHARGE INSTRUCTIONS FOR KIDNEY STONE/URETERAL STENT   MEDICATIONS:  1. Resume all your other meds from home  2. AZO over the counter can help with the burning/stinging when you urinate. 3. Tramadol is for moderate/severe pain, otherwise taking up to 1000 mg every 6 hours of plainTylenol will help treat your pain.   4. Take Cephalexin one hour prior to removal of your stent.    ACTIVITY:  1. No strenuous activity x 1week  2. No driving while on narcotic pain medications  3. Drink plenty of water  4. Continue to walk at home - you can still get blood clots when you are at home, so keep active, but don't over do it.  5. May return to work/school tomorrow or when you feel ready   BATHING:  1. You can shower and we recommend daily showers  2. You have a string coming from your urethra: The stent string is attached to your ureteral stent. Do not pull on this.   SIGNS/SYMPTOMS TO CALL:  Please call us if you have a fever greater than 101.5, uncontrolled nausea/vomiting, uncontrolled pain, dizziness, unable to urinate, bloody urine, chest pain, shortness of breath, leg swelling, leg pain, redness around wound, drainage from wound, or any other concerns or questions.   You can reach [place] at [phone].   FOLLOW-UP:  1. You have a string attached to your stent, you may remove it on Friday, June 7. To do this, pull the string until the stent is completely removed. You may feel an odd sensation in your back.

## 2022-10-04 NOTE — Op Note (Signed)
Preoperative diagnosis: right renal calculus  Postoperative diagnosis: right renal calculus  Procedure:  Cystoscopy right ureteroscopy, laser lithotripsy, basket stone extraction right 59F x 24cm ureteral stent - exchange with tether   Surgeon: Kasandra Knudsen, MD  Anesthesia: General  Complications: None  Intraoperative findings:  Normal urethra Bilateral orthotropic ureteral orifices Bladder mucosa normal without masses   EBL: Minimal  Specimens: right renal calculus  Disposition of specimens: [facility] for stone analysis  Indication: Kelly Reyes is a 77 y.o.   patient with a  right renal stone here for staged procedure for residual stone burden. After reviewing the management options for treatment, the patient elected to proceed with the above surgical procedure(s). We have discussed the potential benefits and risks of the procedure, side effects of the proposed treatment, the likelihood of the patient achieving the goals of the procedure, and any potential problems that might occur during the procedure or recuperation. Informed consent has been obtained.   Description of procedure:  The patient was taken to the operating room and general anesthesia was induced.  The patient was placed in the dorsal lithotomy position, prepped and draped in the usual sterile fashion, and preoperative antibiotics were administered. A preoperative time-out was performed.   Cystourethroscopy was performed.  The patient's urethra was examined and was normal.  There was no evidence for any bladder tumors, stones, or other mucosal pathology.    Attention then turned to the right ureteral orifice and graspers were used to bring the existing ureteral stent to the urethral meatus.  A 0.38 sensor was then advanced through the ureteral stent and up to the kidney with fluoroscopic guidance.  The stent was removed and deemed to be intact.  A second sensor wire was placed alongside  the for sensor wire in similar manner.  1 wire was secured as a sensor wire.  The cystoscope was removed.  A ureteral access sheath was placed over the second wire with fluoroscopic guidance up to the proximal ureter.  The inner sheath and wire removed.    Next flexible ureteroscopy then took place.  The previously identified stone had been fragmented many many small pieces.  A 200 m Moses laser fiber was used to dust the remaining fragments.  Basket stone extraction then took place with a 0 tip basket to try and clear any residual stone burden.  At the remainder of the case the remaining stone burden or fragments less than 1 mm in size which could not be easily basketed.  The ureteroscope was removed in unison with the access sheath and care examine the ureter on the way out.   The wire was then backloaded through the cystoscope and a ureteral stent was advance over the wire using Seldinger technique.  The stent was positioned appropriately under fluoroscopic and cystoscopic guidance.  The wire was then removed with an adequate stent curl noted in the renal pelvis as well as in the bladder.  The bladder was then emptied and the procedure ended.  The patient appeared to tolerate the procedure well and without complications.  The patient was able to be awakened and transferred to the recovery unit in satisfactory condition.   Disposition: The tether of the stent was left on and secured to the ventral aspect of the patient's penis. tucked inside the patient's vagina.  Instructions for removing the stent have been provided to the patient.

## 2022-10-04 NOTE — Transfer of Care (Signed)
Immediate Anesthesia Transfer of Care Note  Patient: Kelly Reyes  Procedure(s) Performed: CYSTOSCOPY, RIGHT URETEROSCOPY, RIGHTT RETROGRADE PYELOGRAM, HOLMIUM LASER LITHOTRIPSY, AND RIGHT URETERAL STENT EXCHANGE (Right: Bladder)  Patient Location: PACU  Anesthesia Type:General  Level of Consciousness: drowsy  Airway & Oxygen Therapy: Patient Spontanous Breathing and Patient connected to face mask oxygen  Post-op Assessment: Report given to RN and Post -op Vital signs reviewed and stable  Post vital signs: Reviewed and stable  Last Vitals:  Vitals Value Taken Time  BP 125/88 10/04/22 1415  Temp    Pulse 77 10/04/22 1416  Resp 19 10/04/22 1416  SpO2 100 % 10/04/22 1416  Vitals shown include unvalidated device data.  Last Pain:  Vitals:   10/04/22 1132  TempSrc: Oral  PainSc: 0-No pain         Complications: No notable events documented.

## 2022-10-04 NOTE — OR Nursing (Signed)
Stones sent with surgeon

## 2022-10-04 NOTE — Anesthesia Postprocedure Evaluation (Signed)
Anesthesia Post Note  Patient: Kelly Reyes  Procedure(s) Performed: CYSTOSCOPY, RIGHT URETEROSCOPY, RIGHTT RETROGRADE PYELOGRAM, HOLMIUM LASER LITHOTRIPSY, AND RIGHT URETERAL STENT EXCHANGE (Right: Bladder)     Patient location during evaluation: PACU Anesthesia Type: General Level of consciousness: awake and alert Pain management: pain level controlled Vital Signs Assessment: post-procedure vital signs reviewed and stable Respiratory status: spontaneous breathing, nonlabored ventilation and respiratory function stable Cardiovascular status: blood pressure returned to baseline and stable Postop Assessment: no apparent nausea or vomiting Anesthetic complications: no  No notable events documented.  Last Vitals:  Vitals:   10/04/22 1445 10/04/22 1515  BP: 116/74 131/72  Pulse: 60 60  Resp: 13   Temp: 36.4 C 36.4 C  SpO2: 98% 100%    Last Pain:  Vitals:   10/04/22 1515  TempSrc:   PainSc: 0-No pain                 Yader Criger,W. EDMOND

## 2022-10-04 NOTE — Anesthesia Preprocedure Evaluation (Addendum)
Anesthesia Evaluation  Patient identified by MRN, date of birth, ID band Patient awake    Reviewed: Allergy & Precautions, H&P , NPO status , Patient's Chart, lab work & pertinent test results  Airway Mallampati: II  TM Distance: >3 FB Neck ROM: Full    Dental no notable dental hx. (+) Upper Dentures, Partial Lower, Dental Advisory Given   Pulmonary neg pulmonary ROS   Pulmonary exam normal breath sounds clear to auscultation       Cardiovascular hypertension, Pt. on medications  Rhythm:Regular Rate:Normal     Neuro/Psych negative neurological ROS  negative psych ROS   GI/Hepatic negative GI ROS, Neg liver ROS,,,  Endo/Other  negative endocrine ROS    Renal/GU Renal disease  negative genitourinary   Musculoskeletal  (+) Arthritis , Osteoarthritis,    Abdominal   Peds  Hematology negative hematology ROS (+)   Anesthesia Other Findings   Reproductive/Obstetrics negative OB ROS                             Anesthesia Physical Anesthesia Plan  ASA: 2  Anesthesia Plan: General   Post-op Pain Management: Tylenol PO (pre-op)*   Induction: Intravenous  PONV Risk Score and Plan: 4 or greater and Ondansetron, Dexamethasone, Treatment may vary due to age or medical condition, Propofol infusion and TIVA  Airway Management Planned: LMA  Additional Equipment:   Intra-op Plan:   Post-operative Plan: Extubation in OR  Informed Consent: I have reviewed the patients History and Physical, chart, labs and discussed the procedure including the risks, benefits and alternatives for the proposed anesthesia with the patient or authorized representative who has indicated his/her understanding and acceptance.     Dental advisory given  Plan Discussed with: CRNA  Anesthesia Plan Comments:        Anesthesia Quick Evaluation

## 2022-10-04 NOTE — Interval H&P Note (Signed)
History and Physical Interval Note:  10/04/2022 12:40 PM  Kelly Reyes  has presented today for surgery, with the diagnosis of RIGHT URETERAL AND RENAL CALCULUS.  The various methods of treatment have been discussed with the patient and family. After consideration of risks, benefits and other options for treatment, the patient has consented to  Procedure(s) with comments: CYSTOSCOPY, RIGHT URETEROSCOPY, RIGHTT RETROGRADE PYELOGRAM, HOLMIUM LASER LITHOTRIPSY, AND RIGHT URETERAL STENT EXCHANGE (Right) - 60 MINUTES as a surgical intervention.  The patient's history has been reviewed, patient examined, no change in status, stable for surgery.  I have reviewed the patient's chart and labs.  Questions were answered to the patient's satisfaction.     Atley Neubert D Vicenta Olds

## 2022-10-05 ENCOUNTER — Encounter (HOSPITAL_COMMUNITY): Payer: Self-pay | Admitting: Urology

## 2022-10-10 DIAGNOSIS — J40 Bronchitis, not specified as acute or chronic: Secondary | ICD-10-CM | POA: Diagnosis not present

## 2022-10-11 DIAGNOSIS — R8271 Bacteriuria: Secondary | ICD-10-CM | POA: Diagnosis not present

## 2022-10-11 DIAGNOSIS — N132 Hydronephrosis with renal and ureteral calculous obstruction: Secondary | ICD-10-CM | POA: Diagnosis not present

## 2022-10-26 DIAGNOSIS — R609 Edema, unspecified: Secondary | ICD-10-CM | POA: Diagnosis not present

## 2022-10-26 DIAGNOSIS — N139 Obstructive and reflux uropathy, unspecified: Secondary | ICD-10-CM | POA: Diagnosis not present

## 2022-10-26 DIAGNOSIS — I10 Essential (primary) hypertension: Secondary | ICD-10-CM | POA: Diagnosis not present

## 2022-10-31 DIAGNOSIS — N200 Calculus of kidney: Secondary | ICD-10-CM | POA: Diagnosis not present

## 2022-11-01 DIAGNOSIS — M25551 Pain in right hip: Secondary | ICD-10-CM | POA: Diagnosis not present

## 2022-11-22 DIAGNOSIS — H40023 Open angle with borderline findings, high risk, bilateral: Secondary | ICD-10-CM | POA: Diagnosis not present

## 2022-11-22 DIAGNOSIS — H2513 Age-related nuclear cataract, bilateral: Secondary | ICD-10-CM | POA: Diagnosis not present

## 2022-11-22 DIAGNOSIS — H5213 Myopia, bilateral: Secondary | ICD-10-CM | POA: Diagnosis not present

## 2022-12-07 DIAGNOSIS — N132 Hydronephrosis with renal and ureteral calculous obstruction: Secondary | ICD-10-CM | POA: Diagnosis not present

## 2022-12-19 DIAGNOSIS — N1330 Unspecified hydronephrosis: Secondary | ICD-10-CM | POA: Diagnosis not present

## 2022-12-19 DIAGNOSIS — N179 Acute kidney failure, unspecified: Secondary | ICD-10-CM | POA: Diagnosis not present

## 2022-12-19 DIAGNOSIS — I129 Hypertensive chronic kidney disease with stage 1 through stage 4 chronic kidney disease, or unspecified chronic kidney disease: Secondary | ICD-10-CM | POA: Diagnosis not present

## 2022-12-19 DIAGNOSIS — E785 Hyperlipidemia, unspecified: Secondary | ICD-10-CM | POA: Diagnosis not present

## 2023-01-09 DIAGNOSIS — N132 Hydronephrosis with renal and ureteral calculous obstruction: Secondary | ICD-10-CM | POA: Diagnosis not present

## 2023-01-10 DIAGNOSIS — I129 Hypertensive chronic kidney disease with stage 1 through stage 4 chronic kidney disease, or unspecified chronic kidney disease: Secondary | ICD-10-CM | POA: Diagnosis not present

## 2023-01-11 ENCOUNTER — Other Ambulatory Visit: Payer: Self-pay

## 2023-01-11 DIAGNOSIS — I340 Nonrheumatic mitral (valve) insufficiency: Secondary | ICD-10-CM

## 2023-01-11 DIAGNOSIS — I10 Essential (primary) hypertension: Secondary | ICD-10-CM

## 2023-01-11 DIAGNOSIS — I341 Nonrheumatic mitral (valve) prolapse: Secondary | ICD-10-CM

## 2023-01-13 DIAGNOSIS — N132 Hydronephrosis with renal and ureteral calculous obstruction: Secondary | ICD-10-CM | POA: Diagnosis not present

## 2023-01-13 DIAGNOSIS — N1339 Other hydronephrosis: Secondary | ICD-10-CM | POA: Diagnosis not present

## 2023-02-09 DIAGNOSIS — Z1231 Encounter for screening mammogram for malignant neoplasm of breast: Secondary | ICD-10-CM | POA: Diagnosis not present

## 2023-03-10 DIAGNOSIS — E441 Mild protein-calorie malnutrition: Secondary | ICD-10-CM | POA: Diagnosis not present

## 2023-03-10 DIAGNOSIS — N1831 Chronic kidney disease, stage 3a: Secondary | ICD-10-CM | POA: Diagnosis not present

## 2023-03-10 DIAGNOSIS — E7800 Pure hypercholesterolemia, unspecified: Secondary | ICD-10-CM | POA: Diagnosis not present

## 2023-03-10 DIAGNOSIS — I10 Essential (primary) hypertension: Secondary | ICD-10-CM | POA: Diagnosis not present

## 2023-03-10 DIAGNOSIS — Z23 Encounter for immunization: Secondary | ICD-10-CM | POA: Diagnosis not present

## 2023-03-27 DIAGNOSIS — Z01419 Encounter for gynecological examination (general) (routine) without abnormal findings: Secondary | ICD-10-CM | POA: Diagnosis not present

## 2023-04-19 DIAGNOSIS — L309 Dermatitis, unspecified: Secondary | ICD-10-CM | POA: Diagnosis not present

## 2023-04-19 DIAGNOSIS — D225 Melanocytic nevi of trunk: Secondary | ICD-10-CM | POA: Diagnosis not present

## 2023-04-19 DIAGNOSIS — L821 Other seborrheic keratosis: Secondary | ICD-10-CM | POA: Diagnosis not present

## 2023-04-19 DIAGNOSIS — L819 Disorder of pigmentation, unspecified: Secondary | ICD-10-CM | POA: Diagnosis not present

## 2023-04-19 DIAGNOSIS — D2272 Melanocytic nevi of left lower limb, including hip: Secondary | ICD-10-CM | POA: Diagnosis not present

## 2023-05-04 DIAGNOSIS — M25551 Pain in right hip: Secondary | ICD-10-CM | POA: Diagnosis not present

## 2023-05-23 DIAGNOSIS — H401131 Primary open-angle glaucoma, bilateral, mild stage: Secondary | ICD-10-CM | POA: Diagnosis not present

## 2023-06-02 ENCOUNTER — Ambulatory Visit (HOSPITAL_COMMUNITY): Payer: Medicare Other | Attending: Cardiology

## 2023-06-02 ENCOUNTER — Other Ambulatory Visit: Payer: Federal, State, Local not specified - PPO

## 2023-06-02 DIAGNOSIS — I341 Nonrheumatic mitral (valve) prolapse: Secondary | ICD-10-CM | POA: Diagnosis not present

## 2023-06-02 DIAGNOSIS — I340 Nonrheumatic mitral (valve) insufficiency: Secondary | ICD-10-CM | POA: Insufficient documentation

## 2023-06-02 DIAGNOSIS — I10 Essential (primary) hypertension: Secondary | ICD-10-CM | POA: Insufficient documentation

## 2023-06-02 LAB — ECHOCARDIOGRAM COMPLETE
Area-P 1/2: 3.63 cm2
Calc EF: 53.8 %
S' Lateral: 2.6 cm
Single Plane A2C EF: 53.4 %
Single Plane A4C EF: 53.9 %

## 2023-06-03 ENCOUNTER — Encounter: Payer: Self-pay | Admitting: Cardiology

## 2023-06-03 NOTE — Progress Notes (Signed)
Normal LV systolic function, mild to moderate tricuspid regurgitation and trace mitral regurgitation, very stable overall from echocardiogram 2 years ago.  She has an appointment with me soon and I will discuss further.  MyChart message sent as well.

## 2023-06-14 DIAGNOSIS — N1831 Chronic kidney disease, stage 3a: Secondary | ICD-10-CM | POA: Diagnosis not present

## 2023-06-14 DIAGNOSIS — E785 Hyperlipidemia, unspecified: Secondary | ICD-10-CM | POA: Diagnosis not present

## 2023-06-14 DIAGNOSIS — I129 Hypertensive chronic kidney disease with stage 1 through stage 4 chronic kidney disease, or unspecified chronic kidney disease: Secondary | ICD-10-CM | POA: Diagnosis not present

## 2023-06-14 DIAGNOSIS — N1330 Unspecified hydronephrosis: Secondary | ICD-10-CM | POA: Diagnosis not present

## 2023-06-15 ENCOUNTER — Encounter: Payer: Self-pay | Admitting: Cardiology

## 2023-06-15 ENCOUNTER — Ambulatory Visit: Payer: Medicare Other | Attending: Cardiology | Admitting: Cardiology

## 2023-06-15 VITALS — BP 140/90 | HR 82 | Resp 16 | Ht 66.0 in | Wt 117.0 lb

## 2023-06-15 DIAGNOSIS — I341 Nonrheumatic mitral (valve) prolapse: Secondary | ICD-10-CM | POA: Insufficient documentation

## 2023-06-15 DIAGNOSIS — R002 Palpitations: Secondary | ICD-10-CM | POA: Insufficient documentation

## 2023-06-15 DIAGNOSIS — I10 Essential (primary) hypertension: Secondary | ICD-10-CM | POA: Insufficient documentation

## 2023-06-15 MED ORDER — METOPROLOL SUCCINATE ER 50 MG PO TB24: 50.0000 mg | ORAL_TABLET | Freq: Every day | ORAL | 0 refills | Status: AC

## 2023-06-15 NOTE — Progress Notes (Signed)
Cardiology Office Note:  .   Date:  06/15/2023  ID:  Kelly Reyes, DOB 07-28-1945, MRN [id number] PCP: Lorenda Ishihara, MD  [facility] Providers Cardiologist:  None   History of Present Illness: .   Kelly Reyes is a 78 y.o. 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  She was last seen by me in December 24 for preop eval for right hip arthroplasty.  She now presents for follow-up of mitral regurgitation.  She remains asymptomatic.  She has occasional palpitations.  Continues to remain active.  She has had kidney stones removed and has had multiple procedures for removal of a very large renal stone with improvement in renal function since then.  Labs   Lab Results  Component Value Date   NA 141 09/05/2022   K 3.9 09/05/2022   CO2 26 09/05/2022   GLUCOSE 96 09/05/2022   BUN 21 09/05/2022   CREATININE 1.40 (H) 09/05/2022   CALCIUM 9.0 09/05/2022   GFRNONAA 39 (L) 09/05/2022      Latest Ref Rng & Units 09/05/2022   10:44 AM 08/15/2022    1:00 PM 07/12/2022    8:33 AM  BMP  Glucose 70 - 99 mg/dL 96  92  98   BUN 8 - 23 mg/dL 21  20  27    Creatinine 0.44 - 1.00 mg/dL 4.09  8.11  9.14   Sodium 135 - 145 mmol/L 141  143  140   Potassium 3.5 - 5.1 mmol/L 3.9  3.6  3.2   Chloride 98 - 111 mmol/L 107  102  102   CO2 22 - 32 mmol/L 26  24  26    Calcium 8.9 - 10.3 mg/dL 9.0  9.8  9.3       Latest Ref Rng & Units 08/15/2022    1:00 PM 07/13/2022    4:15 AM 07/06/2022   10:05 AM  CBC  WBC 4.0 - 10.5 K/uL 8.5  14.6  6.8   Hemoglobin 12.0 - 15.0 g/dL 78.2  95.6  21.3   Hematocrit 36.0 - 46.0 % 39.9  36.7  40.5   Platelets 150 - 400 K/uL 258  194  231    External Labs:  PCP Labs 01/10/2023:  Serum creatinine 1.250, potassium 4.4, EGFR 43 mL.  Review of Systems  Cardiovascular:  Positive  for palpitations. Negative for chest pain, dyspnea on exertion and leg swelling.   Physical Exam:   VS:  BP (!) 140/90 (BP Location: Left Arm, Patient Position: Sitting, Cuff Size: Normal)   Pulse 82   Resp 16   Ht 5\' 6"  (1.676 m)   Wt 117 lb (53.1 kg)   SpO2 98%   BMI 18.88 kg/m    Wt Readings from Last 3 Encounters:  06/15/23 117 lb (53.1 kg)  10/04/22 122 lb (55.3 kg)  09/13/22 121 lb 4.1 oz (55 kg)    Physical Exam Neck:     Vascular: No carotid bruit or JVD.  Cardiovascular:     Rate and Rhythm: Normal rate and regular rhythm.     Pulses: Intact distal pulses.     Heart sounds: Normal heart sounds. No murmur heard.    No gallop.  Pulmonary:     Effort: Pulmonary effort is normal.     Breath sounds: Normal breath sounds.  Abdominal:     General: Bowel sounds are normal.  Palpations: Abdomen is soft.  Musculoskeletal:     Right lower leg: No edema.     Left lower leg: No edema.   Studies Reviewed: Marland Kitchen    ECHOCARDIOGRAM COMPLETE 06/02/2023  1. Left ventricular ejection fraction, by estimation, is 55 to 60%. The left ventricle has normal function. The left ventricle has no regional wall motion abnormalities. Left ventricular diastolic parameters were normal. 2. Right ventricular systolic function is normal. The right ventricular size is normal. There is normal pulmonary artery systolic pressure. The estimated right ventricular systolic pressure is 25.5 mmHg. 3. The mitral valve is normal in structure. Trivial mitral valve regurgitation. No evidence of mitral stenosis. 4. Tricuspid valve regurgitation is mild to moderate. 5. The aortic valve is tricuspid. There is mild calcification of the aortic valve. Aortic valve regurgitation is not visualized. No aortic stenosis is present. 6. The inferior vena cava is normal in size with greater than 50% respiratory variability, suggesting right atrial pressure of 3 mmHg.  EKG:         EKG 04/19/2022: Normal sinus rhythm at rate  of 78 bpm, borderline left atrial enlargement, normal axis. No evidence of ischemia otherwise normal EKG.   Medications and allergies    Allergies  Allergen Reactions  . Amlodipine Swelling  . Codeine Nausea Only  . Oxycodone Nausea Only     Current Outpatient Medications:  .  hydrochlorothiazide (HYDRODIURIL) 12.5 MG tablet, Take 12.5 mg by mouth every morning., Disp: , Rfl:  .  latanoprost (XALATAN) 0.005 % ophthalmic solution, 1 drop at bedtime., Disp: , Rfl:  .  metoprolol succinate (TOPROL-XL) 50 MG 24 hr tablet, Take 1 tablet (50 mg total) by mouth daily. Take with or immediately following a meal., Disp: 90 tablet, Rfl: 0 .  rosuvastatin (CRESTOR) 10 MG tablet, Take 10 mg by mouth at bedtime., Disp: , Rfl:  .  telmisartan (MICARDIS) 20 MG tablet, Take 20 mg by mouth daily., Disp: , Rfl:    ASSESSMENT AND PLAN: .      ICD-10-CM   1. MVP (mitral valve prolapse)  I34.1     2. Primary hypertension  I10 metoprolol succinate (TOPROL-XL) 50 MG 24 hr tablet    3. Palpitations  R00.2 metoprolol succinate (TOPROL-XL) 50 MG 24 hr tablet     1. MVP (mitral valve prolapse) (Primary) Patient's mitral prolapse is very mild, recent echocardiogram from a week ago reveals trace MR and mild to moderate TR, she remains asymptomatic and no murmurs appreciated on auscultation.  Continue conservative management, unless a loud murmur is heard or she develops dyspnea, I will see her back on a as needed basis.  2. Primary hypertension Blood pressure is elevated, she was also told to have elevated blood pressure yesterday when she saw her nephrologist.  I will add metoprolol succinate 50 mg daily for both hypertension and palpitations. - metoprolol succinate (TOPROL-XL) 50 MG 24 hr tablet; Take 1 tablet (50 mg total) by mouth daily. Take with or immediately following a meal.  Dispense: 90 tablet; Refill: 0  3. Palpitations Chronic palpitations, brief suggestive of PACs and PVCs.  Do not suspect  atrial fibrillation. - metoprolol succinate (TOPROL-XL) 50 MG 24 hr tablet; Take 1 tablet (50 mg total) by mouth daily. Take with or immediately following a meal.  Dispense: 90 tablet; Refill: 0  Patient will follow-up with her PCP for further management and evaluation and will see her back on a as needed basis.  Signed,  Yates Decamp, MD, [facility] 06/15/2023,  11:13 AM [facility] [address] [place], [place] [address] Phone: [phone]. Fax:  [phone]

## 2023-06-15 NOTE — Patient Instructions (Signed)
Medication Instructions:  Your physician has recommended you make the following change in your medication:  Start toprol XL 50 mg daily. Refills should be filled by your Primary Care.  Lab Work: None ordered.  If you have labs (blood work) drawn today and your tests are completely normal, you will receive your results only by: MyChart Message (if you have MyChart) OR A paper copy in the mail If you have any lab test that is abnormal or we need to change your treatment, we will call you to review the results.  Testing/Procedures: None ordered.  Follow-Up: At [facility], you and your health needs are our priority.  As part of our continuing mission to provide you with exceptional heart care, we have created designated Provider Care Teams.  These Care Teams include your primary Cardiologist (physician) and Advanced Practice Providers (APPs -  Physician Assistants and Nurse Practitioners) who all work together to provide you with the care you need, when you need it.  We recommend signing up for the patient portal called "MyChart".  Sign up information is provided on this After Visit Summary.  MyChart is used to connect with patients for Virtual Visits (Telemedicine).  Patients are able to view lab/test results, encounter notes, upcoming appointments, etc.  Non-urgent messages can be sent to your provider as well.   To learn more about what you can do with MyChart, go to [email or web address].    Your next appointment:   As needed  The format for your next appointment:   In Person  Provider:   Yates Decamp, MD  Important Information About Sugar

## 2023-06-20 DIAGNOSIS — H401131 Primary open-angle glaucoma, bilateral, mild stage: Secondary | ICD-10-CM | POA: Diagnosis not present

## 2023-07-03 DIAGNOSIS — I10 Essential (primary) hypertension: Secondary | ICD-10-CM | POA: Diagnosis not present

## 2023-07-03 DIAGNOSIS — I080 Rheumatic disorders of both mitral and aortic valves: Secondary | ICD-10-CM | POA: Diagnosis not present

## 2023-07-03 DIAGNOSIS — N1832 Chronic kidney disease, stage 3b: Secondary | ICD-10-CM | POA: Diagnosis not present

## 2023-07-03 DIAGNOSIS — R000 Tachycardia, unspecified: Secondary | ICD-10-CM | POA: Diagnosis not present

## 2023-07-31 DIAGNOSIS — E7800 Pure hypercholesterolemia, unspecified: Secondary | ICD-10-CM | POA: Diagnosis not present

## 2023-07-31 DIAGNOSIS — M1610 Unilateral primary osteoarthritis, unspecified hip: Secondary | ICD-10-CM | POA: Diagnosis not present

## 2023-07-31 DIAGNOSIS — E785 Hyperlipidemia, unspecified: Secondary | ICD-10-CM | POA: Diagnosis not present

## 2023-07-31 DIAGNOSIS — N1831 Chronic kidney disease, stage 3a: Secondary | ICD-10-CM | POA: Diagnosis not present

## 2023-08-17 ENCOUNTER — Telehealth: Payer: Self-pay | Admitting: Cardiology

## 2023-08-17 DIAGNOSIS — I10 Essential (primary) hypertension: Secondary | ICD-10-CM | POA: Diagnosis not present

## 2023-08-17 NOTE — Telephone Encounter (Signed)
 Pt c/o BP issue: STAT if pt c/o blurred vision, one-sided weakness or slurred speech.  STAT if BP is GREATER than 180/120 TODAY.  STAT if BP is LESS than 90/60 and SYMPTOMATIC TODAY  1. What is your BP concern?   Low BP   2. Have you taken any BP medication today?  No  3. What are your last 5 BP readings?  Yesterday 104/69 and 99/70 a few minutes later  4. Are you having any other symptoms (ex. Dizziness, headache, blurred vision, passed out)?  No   Patient stated her BP has been running low since being put on new medication - metoprolol succinate (TOPROL-XL) 50 MG 24 hr tablet

## 2023-08-17 NOTE — Telephone Encounter (Signed)
 I spoke with patient.  She reports she did not start Toprol 50 mg daily.  After seeing PCP she started at 25 mg daily.  She checks BP every day and reports the following readings  Yesterday-104/69, 99/70.  She felt fine  4/11- 104/76 4/12-122/74 4/13-104/79 4/14-122/78 4/15-111/73  She has not checked BP today but is feeling fine Patient became concerned with 99/70 reading yesterday.  I assured her as long as she was feeling well this was not dangerously low.  I let her know when BP running low it was OK to hold dose of Toprol. Patient is seeing PA at PCP office today and will discuss readings at this appointment

## 2023-08-30 DIAGNOSIS — N1831 Chronic kidney disease, stage 3a: Secondary | ICD-10-CM | POA: Diagnosis not present

## 2023-08-30 DIAGNOSIS — M1610 Unilateral primary osteoarthritis, unspecified hip: Secondary | ICD-10-CM | POA: Diagnosis not present

## 2023-08-30 DIAGNOSIS — E785 Hyperlipidemia, unspecified: Secondary | ICD-10-CM | POA: Diagnosis not present

## 2023-08-30 DIAGNOSIS — E7800 Pure hypercholesterolemia, unspecified: Secondary | ICD-10-CM | POA: Diagnosis not present

## 2023-09-08 DIAGNOSIS — R636 Underweight: Secondary | ICD-10-CM | POA: Diagnosis not present

## 2023-09-08 DIAGNOSIS — Z1331 Encounter for screening for depression: Secondary | ICD-10-CM | POA: Diagnosis not present

## 2023-09-08 DIAGNOSIS — E7800 Pure hypercholesterolemia, unspecified: Secondary | ICD-10-CM | POA: Diagnosis not present

## 2023-09-08 DIAGNOSIS — I080 Rheumatic disorders of both mitral and aortic valves: Secondary | ICD-10-CM | POA: Diagnosis not present

## 2023-09-08 DIAGNOSIS — M1610 Unilateral primary osteoarthritis, unspecified hip: Secondary | ICD-10-CM | POA: Diagnosis not present

## 2023-09-08 DIAGNOSIS — I10 Essential (primary) hypertension: Secondary | ICD-10-CM | POA: Diagnosis not present

## 2023-09-08 DIAGNOSIS — N1831 Chronic kidney disease, stage 3a: Secondary | ICD-10-CM | POA: Diagnosis not present

## 2023-09-08 DIAGNOSIS — Z23 Encounter for immunization: Secondary | ICD-10-CM | POA: Diagnosis not present

## 2023-09-08 DIAGNOSIS — E46 Unspecified protein-calorie malnutrition: Secondary | ICD-10-CM | POA: Diagnosis not present

## 2023-09-08 DIAGNOSIS — R609 Edema, unspecified: Secondary | ICD-10-CM | POA: Diagnosis not present

## 2023-09-08 DIAGNOSIS — Z0000 Encounter for general adult medical examination without abnormal findings: Secondary | ICD-10-CM | POA: Diagnosis not present

## 2023-09-12 DIAGNOSIS — I129 Hypertensive chronic kidney disease with stage 1 through stage 4 chronic kidney disease, or unspecified chronic kidney disease: Secondary | ICD-10-CM | POA: Diagnosis not present

## 2023-09-12 DIAGNOSIS — E785 Hyperlipidemia, unspecified: Secondary | ICD-10-CM | POA: Diagnosis not present

## 2023-09-12 DIAGNOSIS — N1831 Chronic kidney disease, stage 3a: Secondary | ICD-10-CM | POA: Diagnosis not present

## 2023-09-12 DIAGNOSIS — N1330 Unspecified hydronephrosis: Secondary | ICD-10-CM | POA: Diagnosis not present

## 2023-09-30 DIAGNOSIS — E785 Hyperlipidemia, unspecified: Secondary | ICD-10-CM | POA: Diagnosis not present

## 2023-09-30 DIAGNOSIS — N1831 Chronic kidney disease, stage 3a: Secondary | ICD-10-CM | POA: Diagnosis not present

## 2023-09-30 DIAGNOSIS — E7800 Pure hypercholesterolemia, unspecified: Secondary | ICD-10-CM | POA: Diagnosis not present

## 2023-09-30 DIAGNOSIS — M1610 Unilateral primary osteoarthritis, unspecified hip: Secondary | ICD-10-CM | POA: Diagnosis not present

## 2023-10-17 DIAGNOSIS — I10 Essential (primary) hypertension: Secondary | ICD-10-CM | POA: Diagnosis not present

## 2023-10-17 DIAGNOSIS — R609 Edema, unspecified: Secondary | ICD-10-CM | POA: Diagnosis not present

## 2023-10-17 DIAGNOSIS — N1831 Chronic kidney disease, stage 3a: Secondary | ICD-10-CM | POA: Diagnosis not present

## 2023-10-17 DIAGNOSIS — E7800 Pure hypercholesterolemia, unspecified: Secondary | ICD-10-CM | POA: Diagnosis not present

## 2023-10-30 DIAGNOSIS — N1831 Chronic kidney disease, stage 3a: Secondary | ICD-10-CM | POA: Diagnosis not present

## 2023-10-30 DIAGNOSIS — M1610 Unilateral primary osteoarthritis, unspecified hip: Secondary | ICD-10-CM | POA: Diagnosis not present

## 2023-10-30 DIAGNOSIS — E7800 Pure hypercholesterolemia, unspecified: Secondary | ICD-10-CM | POA: Diagnosis not present

## 2023-10-30 DIAGNOSIS — E785 Hyperlipidemia, unspecified: Secondary | ICD-10-CM | POA: Diagnosis not present

## 2023-11-30 DIAGNOSIS — E7800 Pure hypercholesterolemia, unspecified: Secondary | ICD-10-CM | POA: Diagnosis not present

## 2023-11-30 DIAGNOSIS — E785 Hyperlipidemia, unspecified: Secondary | ICD-10-CM | POA: Diagnosis not present

## 2023-11-30 DIAGNOSIS — N1831 Chronic kidney disease, stage 3a: Secondary | ICD-10-CM | POA: Diagnosis not present

## 2023-11-30 DIAGNOSIS — M1610 Unilateral primary osteoarthritis, unspecified hip: Secondary | ICD-10-CM | POA: Diagnosis not present

## 2023-12-19 DIAGNOSIS — H401131 Primary open-angle glaucoma, bilateral, mild stage: Secondary | ICD-10-CM | POA: Diagnosis not present

## 2023-12-31 DIAGNOSIS — M1610 Unilateral primary osteoarthritis, unspecified hip: Secondary | ICD-10-CM | POA: Diagnosis not present

## 2023-12-31 DIAGNOSIS — N1831 Chronic kidney disease, stage 3a: Secondary | ICD-10-CM | POA: Diagnosis not present

## 2023-12-31 DIAGNOSIS — E7800 Pure hypercholesterolemia, unspecified: Secondary | ICD-10-CM | POA: Diagnosis not present

## 2023-12-31 DIAGNOSIS — E785 Hyperlipidemia, unspecified: Secondary | ICD-10-CM | POA: Diagnosis not present

## 2024-01-19 DIAGNOSIS — Z03818 Encounter for observation for suspected exposure to other biological agents ruled out: Secondary | ICD-10-CM | POA: Diagnosis not present

## 2024-01-19 DIAGNOSIS — R3915 Urgency of urination: Secondary | ICD-10-CM | POA: Diagnosis not present

## 2024-01-19 DIAGNOSIS — R059 Cough, unspecified: Secondary | ICD-10-CM | POA: Diagnosis not present

## 2024-01-19 DIAGNOSIS — R739 Hyperglycemia, unspecified: Secondary | ICD-10-CM | POA: Diagnosis not present

## 2024-01-19 DIAGNOSIS — R634 Abnormal weight loss: Secondary | ICD-10-CM | POA: Diagnosis not present

## 2024-01-19 DIAGNOSIS — J449 Chronic obstructive pulmonary disease, unspecified: Secondary | ICD-10-CM | POA: Diagnosis not present

## 2024-01-19 DIAGNOSIS — J40 Bronchitis, not specified as acute or chronic: Secondary | ICD-10-CM | POA: Diagnosis not present

## 2024-01-19 DIAGNOSIS — E46 Unspecified protein-calorie malnutrition: Secondary | ICD-10-CM | POA: Diagnosis not present

## 2024-01-30 DIAGNOSIS — M1610 Unilateral primary osteoarthritis, unspecified hip: Secondary | ICD-10-CM | POA: Diagnosis not present

## 2024-01-30 DIAGNOSIS — E7800 Pure hypercholesterolemia, unspecified: Secondary | ICD-10-CM | POA: Diagnosis not present

## 2024-01-30 DIAGNOSIS — N1831 Chronic kidney disease, stage 3a: Secondary | ICD-10-CM | POA: Diagnosis not present

## 2024-01-30 DIAGNOSIS — E785 Hyperlipidemia, unspecified: Secondary | ICD-10-CM | POA: Diagnosis not present

## 2024-02-02 DIAGNOSIS — Z23 Encounter for immunization: Secondary | ICD-10-CM | POA: Diagnosis not present

## 2024-02-02 DIAGNOSIS — J40 Bronchitis, not specified as acute or chronic: Secondary | ICD-10-CM | POA: Diagnosis not present

## 2024-02-07 DIAGNOSIS — E46 Unspecified protein-calorie malnutrition: Secondary | ICD-10-CM | POA: Diagnosis not present

## 2024-02-07 DIAGNOSIS — I10 Essential (primary) hypertension: Secondary | ICD-10-CM | POA: Diagnosis not present

## 2024-02-07 DIAGNOSIS — N1831 Chronic kidney disease, stage 3a: Secondary | ICD-10-CM | POA: Diagnosis not present

## 2024-02-07 DIAGNOSIS — R634 Abnormal weight loss: Secondary | ICD-10-CM | POA: Diagnosis not present

## 2024-02-07 DIAGNOSIS — E7800 Pure hypercholesterolemia, unspecified: Secondary | ICD-10-CM | POA: Diagnosis not present

## 2024-02-07 DIAGNOSIS — J449 Chronic obstructive pulmonary disease, unspecified: Secondary | ICD-10-CM | POA: Diagnosis not present

## 2024-02-15 DIAGNOSIS — Z1231 Encounter for screening mammogram for malignant neoplasm of breast: Secondary | ICD-10-CM | POA: Diagnosis not present

## 2024-02-16 DIAGNOSIS — N200 Calculus of kidney: Secondary | ICD-10-CM | POA: Diagnosis not present

## 2024-02-16 DIAGNOSIS — E0590 Thyrotoxicosis, unspecified without thyrotoxic crisis or storm: Secondary | ICD-10-CM | POA: Diagnosis not present

## 2024-02-16 DIAGNOSIS — I10 Essential (primary) hypertension: Secondary | ICD-10-CM | POA: Diagnosis not present

## 2024-02-16 DIAGNOSIS — N1831 Chronic kidney disease, stage 3a: Secondary | ICD-10-CM | POA: Diagnosis not present

## 2024-02-16 DIAGNOSIS — E559 Vitamin D deficiency, unspecified: Secondary | ICD-10-CM | POA: Diagnosis not present

## 2024-02-19 DIAGNOSIS — E559 Vitamin D deficiency, unspecified: Secondary | ICD-10-CM | POA: Diagnosis not present

## 2024-02-19 DIAGNOSIS — E0590 Thyrotoxicosis, unspecified without thyrotoxic crisis or storm: Secondary | ICD-10-CM | POA: Diagnosis not present

## 2024-02-19 DIAGNOSIS — N200 Calculus of kidney: Secondary | ICD-10-CM | POA: Diagnosis not present

## 2024-03-01 DIAGNOSIS — M1610 Unilateral primary osteoarthritis, unspecified hip: Secondary | ICD-10-CM | POA: Diagnosis not present

## 2024-03-01 DIAGNOSIS — E7800 Pure hypercholesterolemia, unspecified: Secondary | ICD-10-CM | POA: Diagnosis not present

## 2024-03-01 DIAGNOSIS — N1831 Chronic kidney disease, stage 3a: Secondary | ICD-10-CM | POA: Diagnosis not present

## 2024-03-01 DIAGNOSIS — E785 Hyperlipidemia, unspecified: Secondary | ICD-10-CM | POA: Diagnosis not present

## 2024-03-06 DIAGNOSIS — M1610 Unilateral primary osteoarthritis, unspecified hip: Secondary | ICD-10-CM | POA: Diagnosis not present

## 2024-03-06 DIAGNOSIS — E0590 Thyrotoxicosis, unspecified without thyrotoxic crisis or storm: Secondary | ICD-10-CM | POA: Diagnosis not present

## 2024-03-06 DIAGNOSIS — I10 Essential (primary) hypertension: Secondary | ICD-10-CM | POA: Diagnosis not present

## 2024-03-06 DIAGNOSIS — Z713 Dietary counseling and surveillance: Secondary | ICD-10-CM | POA: Diagnosis not present

## 2024-03-06 DIAGNOSIS — N1831 Chronic kidney disease, stage 3a: Secondary | ICD-10-CM | POA: Diagnosis not present

## 2024-03-08 DIAGNOSIS — R636 Underweight: Secondary | ICD-10-CM | POA: Diagnosis not present

## 2024-03-08 DIAGNOSIS — I10 Essential (primary) hypertension: Secondary | ICD-10-CM | POA: Diagnosis not present

## 2024-03-08 DIAGNOSIS — E0590 Thyrotoxicosis, unspecified without thyrotoxic crisis or storm: Secondary | ICD-10-CM | POA: Diagnosis not present

## 2024-03-31 DIAGNOSIS — E7800 Pure hypercholesterolemia, unspecified: Secondary | ICD-10-CM | POA: Diagnosis not present

## 2024-03-31 DIAGNOSIS — E785 Hyperlipidemia, unspecified: Secondary | ICD-10-CM | POA: Diagnosis not present

## 2024-03-31 DIAGNOSIS — M1610 Unilateral primary osteoarthritis, unspecified hip: Secondary | ICD-10-CM | POA: Diagnosis not present

## 2024-03-31 DIAGNOSIS — N1831 Chronic kidney disease, stage 3a: Secondary | ICD-10-CM | POA: Diagnosis not present

## 2024-04-10 DIAGNOSIS — Z713 Dietary counseling and surveillance: Secondary | ICD-10-CM | POA: Diagnosis not present

## 2024-04-10 DIAGNOSIS — E7800 Pure hypercholesterolemia, unspecified: Secondary | ICD-10-CM | POA: Diagnosis not present

## 2024-04-10 DIAGNOSIS — E0590 Thyrotoxicosis, unspecified without thyrotoxic crisis or storm: Secondary | ICD-10-CM | POA: Diagnosis not present

## 2024-04-10 DIAGNOSIS — I10 Essential (primary) hypertension: Secondary | ICD-10-CM | POA: Diagnosis not present

## 2024-04-10 DIAGNOSIS — N1831 Chronic kidney disease, stage 3a: Secondary | ICD-10-CM | POA: Diagnosis not present
